# Patient Record
Sex: Female | Born: 1940 | Race: White | Hispanic: No | Smoking: Former smoker
Health system: Southern US, Community
[De-identification: ages and names within clinical notes are randomized; demographics above are authoritative.]

## PROBLEM LIST (undated history)

## (undated) DIAGNOSIS — E785 Hyperlipidemia, unspecified: Secondary | ICD-10-CM

## (undated) DIAGNOSIS — M545 Low back pain, unspecified: Secondary | ICD-10-CM

## (undated) DIAGNOSIS — I10 Essential (primary) hypertension: Secondary | ICD-10-CM

## (undated) DIAGNOSIS — G8929 Other chronic pain: Secondary | ICD-10-CM

## (undated) DIAGNOSIS — R55 Syncope and collapse: Secondary | ICD-10-CM

## (undated) HISTORY — DX: Hyperlipidemia, unspecified: E78.5

## (undated) HISTORY — PX: TONSILLECTOMY: SUR1361

## (undated) HISTORY — PX: FRACTURE SURGERY: SHX138

## (undated) HISTORY — DX: Essential (primary) hypertension: I10

---

## 1999-03-21 HISTORY — PX: PERCUTANEOUS PINNING CALCANEAL FRACTURE: SUR1010

## 2000-03-20 HISTORY — PX: CALCANEUS HARDWARE REMOVAL: SHX1283

## 2004-03-20 HISTORY — PX: VAGINAL HYSTERECTOMY: SUR661

## 2004-08-05 LAB — HM PAP SMEAR

## 2005-03-20 HISTORY — PX: BLADDER SUSPENSION: SHX72

## 2011-08-03 ENCOUNTER — Encounter: Payer: Self-pay | Admitting: Internal Medicine

## 2011-08-03 ENCOUNTER — Ambulatory Visit (INDEPENDENT_AMBULATORY_CARE_PROVIDER_SITE_OTHER): Payer: Medicare Other | Admitting: Internal Medicine

## 2011-08-03 VITALS — BP 116/68 | HR 97 | Temp 98.4°F | Resp 14 | Ht 61.0 in | Wt 151.8 lb

## 2011-08-03 DIAGNOSIS — I10 Essential (primary) hypertension: Secondary | ICD-10-CM

## 2011-08-03 DIAGNOSIS — Z135 Encounter for screening for eye and ear disorders: Secondary | ICD-10-CM

## 2011-08-03 DIAGNOSIS — E785 Hyperlipidemia, unspecified: Secondary | ICD-10-CM | POA: Insufficient documentation

## 2011-08-03 DIAGNOSIS — M545 Low back pain: Secondary | ICD-10-CM

## 2011-08-03 DIAGNOSIS — Z0000 Encounter for general adult medical examination without abnormal findings: Secondary | ICD-10-CM

## 2011-08-03 DIAGNOSIS — J302 Other seasonal allergic rhinitis: Secondary | ICD-10-CM

## 2011-08-03 DIAGNOSIS — Z1239 Encounter for other screening for malignant neoplasm of breast: Secondary | ICD-10-CM

## 2011-08-03 DIAGNOSIS — J309 Allergic rhinitis, unspecified: Secondary | ICD-10-CM

## 2011-08-03 MED ORDER — QUINAPRIL-HYDROCHLOROTHIAZIDE 20-25 MG PO TABS: 1.0000 | ORAL_TABLET | Freq: Every day | ORAL | Status: DC

## 2011-08-03 MED ORDER — SIMVASTATIN 20 MG PO TABS: 20.0000 mg | ORAL_TABLET | Freq: Every evening | ORAL | Status: DC

## 2011-08-03 NOTE — Assessment & Plan Note (Signed)
Recently treated for sinusitis secondary to congestion from allergies.  With z pack,   fluticasone and  Zyrtec.

## 2011-08-03 NOTE — Progress Notes (Signed)
Patient ID: Lori Jimenez, female   DOB: 1940/03/27, 71 y.o.   MRN: [id number].  The patient is here for annual Medicare wellness examination and management of other chronic and acute problems.   The risk factors are reflected in the social history.  The roster of all physicians providing medical care to patient - is listed in the Snapshot section of the chart.  Activities of daily living:  The patient is 100% independent in all ADLs: dressing, toileting, feeding as well as independent mobility  Home safety : The patient has smoke detectors in the home. They wear seatbelts.  There are no firearms at home. There is no violence in the home.   There is no risks for hepatitis, STDs or HIV. There is no   history of blood transfusion. They have no travel history to infectious disease endemic areas of the world.  The patient has seen their dentist in the last six month. They have seen their eye doctor in the last year. They admit to slight hearing difficulty with regard to whispered voices and some television programs.  They have deferred audiologic testing in the last year.  They do not  have excessive sun exposure. Discussed the need for sun protection: hats, long sleeves and use of sunscreen if there is significant sun exposure.   Diet: the importance of a healthy diet is discussed. They do have a healthy diet.  The benefits of regular aerobic exercise were discussed. She walks 4 times per week ,  20 minutes.   Depression screen: there are no signs or vegative symptoms of depression- irritability, change in appetite, anhedonia, sadness/tearfullness.  Cognitive assessment: the patient manages all their financial and personal affairs and is actively engaged. They could relate day,date,year and events; recalled 2/3 objects at 3 minutes; performed clock-face test normally.  The following portions of the patient's history were reviewed and updated as appropriate: allergies, current medications, past  family history, past medical history,  past surgical history, past social history  and problem list.  Visual acuity was not assessed per patient preference since she has regular follow up with her ophthalmologist. Hearing and body mass index were assessed and reviewed.   During the course of the visit the patient was educated and counseled about appropriate screening and preventive services including : fall prevention , diabetes screening, nutrition counseling, colorectal cancer screening, and recommended immunizations.    BP 116/68  Pulse 97  Temp(Src) 98.4 F (36.9 C) (Oral)  Resp 14  Ht 5\' 1"  (1.549 m)  Wt 151 lb 12 oz (68.833 kg)  BMI 28.67 kg/m2  SpO2 96%  General appearance: alert, cooperative and appears stated age Ears: normal TM's and external ear canals both ears Throat: lips, mucosa, and tongue normal; teeth and gums normal Neck: no adenopathy, no carotid bruit, supple, symmetrical, trachea midline and thyroid not enlarged, symmetric, no tenderness/mass/nodules Back: symmetric, no curvature. ROM normal. No CVA tenderness. Lungs: clear to auscultation bilaterally Heart: regular rate and rhythm, S1, S2 normal, no murmur, click, rub or gallop Abdomen: soft, non-tender; bowel sounds normal; no masses,  no organomegaly GYN: external genitalia atrophy appropriate foe age, vaginal vault normal, ovaries nonpalpable. Pulses: 2+ and symmetric Skin: Skin color, texture, turgor normal. No rashes or lesions Lymph nodes: Cervical, supraclavicular, and axillary nodes normal.  Assessment and Plan  Allergic rhinitis, seasonal Recently treated for sinusitis secondary to congestion from allergies.  With z pack,   fluticasone and  Zyrtec.    Hyperlipidemia She has been taking  simvastatin for goal LDL less than 100 for over 8 month without fasting lipids or hepatic function are or panel.   she is fortunately fasting today we'll have these labs drawn.  Hypertension Well controlled on  current medications.  No changes today. Renal function ordered.    Routine general medical examination at a health care facility Breast and pelvic exam was done today as part of her annual physical. Mammogram is ordered. She doesn't require Paps since she is status post hysterectomy    Updated Medication List Outpatient Encounter Prescriptions as of 08/03/2011  Medication Sig Dispense Refill  . aspirin 81 MG tablet Take 81 mg by mouth daily.      . calcium carbonate (TUMS - DOSED IN MG ELEMENTAL CALCIUM) 500 MG chewable tablet Chew 1 tablet by mouth daily.      . Cholecalciferol (VITAMIN D3) 1000 UNITS CAPS Take by mouth.      Boris Lown Oil 300 MG CAPS Take by mouth.      . quinapril-hydrochlorothiazide (ACCURETIC) 20-25 MG per tablet Take 1 tablet by mouth daily.  90 tablet  3  . simvastatin (ZOCOR) 20 MG tablet Take 1 tablet (20 mg total) by mouth every evening.  90 tablet  2  . DISCONTD: quinapril-hydrochlorothiazide (ACCURETIC) 20-25 MG per tablet Take 1 tablet by mouth daily.      Marland Kitchen DISCONTD: simvastatin (ZOCOR) 20 MG tablet Take 20 mg by mouth every evening.

## 2011-08-06 ENCOUNTER — Encounter: Payer: Self-pay | Admitting: Internal Medicine

## 2011-08-06 DIAGNOSIS — E785 Hyperlipidemia, unspecified: Secondary | ICD-10-CM | POA: Insufficient documentation

## 2011-08-06 DIAGNOSIS — Z0000 Encounter for general adult medical examination without abnormal findings: Secondary | ICD-10-CM | POA: Insufficient documentation

## 2011-08-06 NOTE — Assessment & Plan Note (Signed)
Breast and pelvic exam was done today as part of her annual physical. Mammogram is ordered. She doesn't require Paps since she is status post hysterectomy

## 2011-08-06 NOTE — Assessment & Plan Note (Signed)
Well controlled on current medications.  No changes today. Renal function ordered.

## 2011-08-06 NOTE — Assessment & Plan Note (Signed)
She has been taking simvastatin for goal LDL less than 100 for over 8 month without fasting lipids or hepatic function are or panel.   she is fortunately fasting today we'll have these labs drawn.

## 2011-08-07 ENCOUNTER — Other Ambulatory Visit: Payer: Medicare Other

## 2011-08-07 ENCOUNTER — Other Ambulatory Visit (INDEPENDENT_AMBULATORY_CARE_PROVIDER_SITE_OTHER): Payer: Medicare Other | Admitting: *Deleted

## 2011-08-07 DIAGNOSIS — E785 Hyperlipidemia, unspecified: Secondary | ICD-10-CM

## 2011-08-07 DIAGNOSIS — I10 Essential (primary) hypertension: Secondary | ICD-10-CM

## 2011-08-07 LAB — LIPID PANEL
Cholesterol: 145 mg/dL (ref 0–200)
LDL Cholesterol: 68 mg/dL (ref 0–99)
VLDL: 23.6 mg/dL (ref 0.0–40.0)

## 2011-08-08 LAB — COMPLETE METABOLIC PANEL WITH GFR
AST: 20 U/L (ref 0–37)
Albumin: 4.8 g/dL (ref 3.5–5.2)
Alkaline Phosphatase: 56 U/L (ref 39–117)
BUN: 12 mg/dL (ref 6–23)
Calcium: 9.8 mg/dL (ref 8.4–10.5)
Chloride: 99 mEq/L (ref 96–112)
Potassium: 3.9 mEq/L (ref 3.5–5.3)
Sodium: 138 mEq/L (ref 135–145)
Total Protein: 7.1 g/dL (ref 6.0–8.3)

## 2011-08-09 ENCOUNTER — Telehealth: Payer: Self-pay | Admitting: *Deleted

## 2011-08-09 NOTE — Telephone Encounter (Signed)
Patient says that she was told that she should come back in for a pneumonia shot, but she had one in 2007. She says that her insurance will not cover it since she has already had one and was over the age of 12 when she got it. Patient is asking if it is necessary for her to have another one.

## 2011-08-09 NOTE — Telephone Encounter (Signed)
No,  document last one in chart

## 2011-08-09 NOTE — Telephone Encounter (Signed)
Chart updated

## 2011-10-26 ENCOUNTER — Encounter: Payer: Self-pay | Admitting: Internal Medicine

## 2011-10-30 ENCOUNTER — Telehealth: Payer: Self-pay | Admitting: Internal Medicine

## 2011-10-30 NOTE — Telephone Encounter (Signed)
Received email back from [facility] and this charge has been taken off of patient's bill.  She no longer has a balance on her account. I also left her another message in regards to this information. I did leave a detailed message and also asked the patient to return my call so I would know she received the message.  This has been taken care of.

## 2011-10-30 NOTE — Telephone Encounter (Signed)
Patient aware and very happy that this has been taken care of.

## 2011-10-30 NOTE — Telephone Encounter (Signed)
Pt called back told her that they working to get her problem resolved

## 2011-10-30 NOTE — Telephone Encounter (Signed)
After talking with Dr. Darrick Huntsman she asked me to send email to billing to get this charge taking off of patient's account.  It was for DOS 5.16.13 where she was charged for pelvic/breast exam, patient came in with bill and stated that  She didn't get on the exam table.  I have left message for patient to return my call so I can tell her that we are trying to get this resolved for her.

## 2011-12-22 ENCOUNTER — Encounter: Payer: Self-pay | Admitting: Internal Medicine

## 2012-05-28 ENCOUNTER — Other Ambulatory Visit: Payer: Self-pay | Admitting: Internal Medicine

## 2012-05-28 DIAGNOSIS — Z0000 Encounter for general adult medical examination without abnormal findings: Secondary | ICD-10-CM

## 2012-05-28 NOTE — Telephone Encounter (Signed)
Pt last seen 08/03/11. Please advise.

## 2012-05-31 ENCOUNTER — Other Ambulatory Visit: Payer: Medicare Other

## 2012-05-31 ENCOUNTER — Other Ambulatory Visit: Payer: Self-pay | Admitting: General Practice

## 2012-05-31 ENCOUNTER — Telehealth: Payer: Self-pay | Admitting: General Practice

## 2012-05-31 MED ORDER — SIMVASTATIN 20 MG PO TABS: 20.0000 mg | ORAL_TABLET | Freq: Every evening | ORAL | Status: DC

## 2012-05-31 NOTE — Telephone Encounter (Signed)
Pt walked in today and scheduled her CMET can you place these orders. I filled the simvastatin #30 with 0 refills. Pt has a CPE scheduled with you on 4/21

## 2012-05-31 NOTE — Addendum Note (Signed)
Addended by: Jackson Latino on: 05/31/2012 10:59 AM   Modules accepted: Orders

## 2012-05-31 NOTE — Telephone Encounter (Signed)
Orders placed.

## 2012-05-31 NOTE — Telephone Encounter (Signed)
Pt walked in today and scheduled appt for her labs.

## 2012-06-03 ENCOUNTER — Other Ambulatory Visit (INDEPENDENT_AMBULATORY_CARE_PROVIDER_SITE_OTHER): Payer: Medicare Other

## 2012-06-03 DIAGNOSIS — Z1239 Encounter for other screening for malignant neoplasm of breast: Secondary | ICD-10-CM

## 2012-06-03 DIAGNOSIS — Z0000 Encounter for general adult medical examination without abnormal findings: Secondary | ICD-10-CM

## 2012-06-03 DIAGNOSIS — E785 Hyperlipidemia, unspecified: Secondary | ICD-10-CM

## 2012-06-03 LAB — COMPREHENSIVE METABOLIC PANEL
AST: 20 U/L (ref 0–37)
Albumin: 4.3 g/dL (ref 3.5–5.2)
BUN: 10 mg/dL (ref 6–23)
CO2: 29 mEq/L (ref 19–32)
Calcium: 9.2 mg/dL (ref 8.4–10.5)
Chloride: 95 mEq/L — ABNORMAL LOW (ref 96–112)
Creatinine, Ser: 0.6 mg/dL (ref 0.4–1.2)
GFR: 98.2 mL/min (ref >60.00)
Glucose, Bld: 113 mg/dL — ABNORMAL HIGH (ref 70–99)
Potassium: 3.5 mEq/L (ref 3.5–5.1)

## 2012-07-11 ENCOUNTER — Telehealth: Payer: Self-pay | Admitting: *Deleted

## 2012-07-11 ENCOUNTER — Other Ambulatory Visit: Payer: Self-pay | Admitting: *Deleted

## 2012-07-11 NOTE — Telephone Encounter (Signed)
Called [facility] to see if the code could be added with the labs done on 3.17.2014, they said that since it has been over a month it cant be done

## 2012-07-11 NOTE — Telephone Encounter (Signed)
Lori Jimenez,  I do not know how to resubmit theses labs, but the codes would be V58.69 and 272.4  Please resubmit.  thanks

## 2012-07-11 NOTE — Telephone Encounter (Signed)
Lab work from 06/03/12 patient states medicare called and stated labs not coded for medical necessity and ask if you would please resubmit so medicare will pay?

## 2012-07-11 NOTE — Telephone Encounter (Signed)
Carollee Herter please handle.  [facility] is mistaken , we have always been able to rebill labs under different codes.    Also please remind patient that she is supposed to be seen every 6 months, not once a year  since we are treating hypertension and hyperlipidemia

## 2012-07-12 NOTE — Telephone Encounter (Signed)
I have sent an email to charge correction for them to change the dx codes per Dr. Darrick Huntsman.

## 2012-07-12 NOTE — Telephone Encounter (Signed)
Lori Jimenez Dr. Darrick Huntsman wanted you to take a look at this please.

## 2012-07-12 NOTE — Telephone Encounter (Signed)
Patient notified by phone of corrections.

## 2012-07-16 ENCOUNTER — Other Ambulatory Visit: Payer: Self-pay | Admitting: Internal Medicine

## 2012-08-07 ENCOUNTER — Encounter: Payer: Self-pay | Admitting: Internal Medicine

## 2012-08-07 ENCOUNTER — Ambulatory Visit (INDEPENDENT_AMBULATORY_CARE_PROVIDER_SITE_OTHER): Payer: Medicare Other | Admitting: Internal Medicine

## 2012-08-07 VITALS — BP 120/80 | HR 80 | Temp 98.2°F | Resp 16 | Ht 62.0 in | Wt 154.5 lb

## 2012-08-07 DIAGNOSIS — E785 Hyperlipidemia, unspecified: Secondary | ICD-10-CM

## 2012-08-07 DIAGNOSIS — Z0000 Encounter for general adult medical examination without abnormal findings: Secondary | ICD-10-CM

## 2012-08-07 DIAGNOSIS — M545 Low back pain: Secondary | ICD-10-CM

## 2012-08-07 DIAGNOSIS — I10 Essential (primary) hypertension: Secondary | ICD-10-CM

## 2012-08-07 MED ORDER — TETANUS-DIPHTH-ACELL PERTUSSIS 5-2.5-18.5 LF-MCG/0.5 IM SUSP: 0.5000 mL | Freq: Once | INTRAMUSCULAR | Status: DC

## 2012-08-07 MED ORDER — QUINAPRIL-HYDROCHLOROTHIAZIDE 20-25 MG PO TABS: 1.0000 | ORAL_TABLET | Freq: Every day | ORAL | Status: DC

## 2012-08-07 MED ORDER — TRAMADOL HCL 50 MG PO TABS: 50.0000 mg | ORAL_TABLET | Freq: Three times a day (TID) | ORAL | Status: DC | PRN

## 2012-08-07 MED ORDER — SIMVASTATIN 20 MG PO TABS: ORAL_TABLET | ORAL | Status: DC

## 2012-08-07 NOTE — Patient Instructions (Addendum)
I want to treat your back pain. We will try a pure pain reliever,   I am going to prescribe tramadol 50 mg   (maximum dose 4 times daily ,  Start with one at bedtime)  This can be combined with tylenol if needed.  The maximum dose of tyleno is 1000 mg at one time,  Or 2000 mg in a 24 hr period.   You may add with either aleve or motrin  To the above regimen .  If you decide to use motrin (ibuprofen) ,  Start with 200 mg ,  Max dose is 800 mg every 8 hours .  For aleve follow bottle instruction.  DO NOT COMBINE ALEVE WITH MOTRIN because they are too similar.  Please call if you need help in escalating your doses of tramadol or if you do not tolerate it ,  Or if it doesn't work   Return in 6 months for  fasting labs    This is  One version of a  "Low GI"  Diet:  It will still lower your blood sugars and allow you to lose 4 to 8  lbs  per month if you follow it carefully.  Your goal with exercise is a minimum of 30 minutes of aerobic exercise 5 days per week (Walking does not count once it becomes easy!)    All of the foods can be found at grocery stores and in bulk at [employer].  The Atkins protein bars and shakes are available in more varieties at Target, WalMart and [employer].     7 AM Breakfast:  Choose from the following:  Low carbohydrate Protein  Shakes (I recommend the [employer]AS AdvantEdge "Carb Control" shakes  Or the low carb shakes by Atkins.    2.5 carbs   Arnold's "Sandwhich Thin"toasted  w/ peanut butter (no jelly: about 20 net carbs  "Bagel Thin" with cream cheese and salmon: about 20 carbs   a scrambled egg/bacon/cheese burrito made with Mission's "carb balance" whole wheat tortilla  (about 10 net carbs )   Avoid cereal and bananas, oatmeal and cream of wheat and grits. They are loaded with carbohydrates!   10 AM: high protein snack  Protein bar by Atkins (the snack size, under 200 cal, usually < 6 net carbs).    A stick of cheese:  Around 1 carb,  100 cal     Dannon Light n Fit  [place] Yogurt  (80 cal, 8 carbs)  Other so called "protein bars" and Greek yogurts tend to be loaded with carbohydrates.  Remember, in food advertising, the word "energy" is synonymous for " carbohydrate."  Lunch:   A Sandwich using the bread choices listed, Can use any  Eggs,  lunchmeat, grilled meat or canned tuna), avocado, regular mayo/mustard  and cheese.  A Salad using blue cheese, ranch,  Goddess or vinagrette,  No croutons or "confetti" and no "candied nuts" but regular nuts OK.   No pretzels or chips.  Pickles and miniature sweet peppers are a good low carb alternative that provide a "crunch"  The bread is the only source of carbohydrate in a sandwich and  can be decreased by trying some of these alternatives to traditional loaf bread  Joseph's makes a pita bread and a flat bread that are 50 cal and 4 net carbs available at BJs and WalMart.  This can be toasted to use with hummous as well  Toufayan makes a low carb flatbread that's 100 cal and  9 net carbs available at [employer] and [employer] makes 2 sizes of  Low carb whole wheat tortilla  (The large one is 210 cal and 6 net carbs) Avoid "Low fat dressings, as well as Reyne Dumas and [address] dressings They are loaded with sugar!   3 PM/ Mid day  Snack:  Consider  1 ounce of  almonds, walnuts, pistachios, pecans, peanuts,  Macadamia nuts or a nut medley.  Avoid "granola"; the dried cranberries and raisins are loaded with carbohydrates. Mixed nuts as long as there are no raisins,  cranberries or dried fruit.     6 PM  Dinner:     Meat/fowl/fish with a green salad, and either broccoli, cauliflower, green beans, spinach, brussel sprouts or  Lima beans. DO NOT BREAD THE PROTEIN!!      There is a low carb pasta by Dreamfield's that is acceptable and tastes great: only 5 digestible carbs/serving.( All grocery stores but BJs carry it )  Try Kai Levins Angelo's chicken piccata or chicken or eggplant parm over low carb pasta.(Lowes and BJs)    Clifton Custard Sanchez's "Carnitas" (pulled pork, no sauce,  0 carbs) or his beef pot roast to make a dinner burrito (at BJ's)  Pesto over low carb pasta (bj's sells a good quality pesto in the center refrigerated section of the deli   Whole wheat pasta is still full of digestible carbs and  Not as low in glycemic index as Dreamfield's.   Brown rice is still rice,  So skip the rice and noodles if you eat [place] or [place] (or at least limit to 1/2 cup)  9 PM snack :   Breyer's "low carb" fudgsicle or  ice cream bar (Carb Smart line), or  Weight Watcher's ice cream bar , or another "no sugar added" ice cream;  a serving of fresh berries/cherries with whipped cream   Cheese or DANNON'S [place] YOGURT  Avoid bananas, pineapple, grapes  and watermelon on a regular basis because they are high in sugar.  THINK OF THEM AS DESSERT  Remember that snack Substitutions should be less than 10 NET carbs per serving and meals < 20 carbs. Remember to subtract fiber grams to get the "net carbs."

## 2012-08-07 NOTE — Progress Notes (Signed)
Patient ID: Lori Jimenez, female   DOB: 06/14/1940, 72 y.o.   MRN: [id number]   The patient is here for annual Medicare wellness examination and management of other chronic and acute problems.   She continues to defer an annual  pelvic exam despite discussion because she is  S/p hysterectomy,   Pelvic sling years ago in [place]. She had a CMEt done in March, and her liver enzymes were normal but her fasting glucose was slightley elevated which was discussed today.  She does a monthly  Self breast exam,,  Does not want one today or a mammogram.    Has Chronic insomnia,  Wakes  Up every hour,  Attributes it to chronic pain in lower back due to DDD .  No meds currenlty except an OTC magnesium supplement to help her relax. Has tried melatonin     The risk factors are reflected in the social history.  The roster of all physicians providing medical care to patient - is listed in the Snapshot section of the chart.  Activities of daily living:  The patient is 100% independent in all ADLs: dressing, toileting, feeding as well as independent mobility  Home safety : The patient has smoke detectors in the home. They wear seatbelts.  There are no firearms at home. There is no violence in the home.   There is no risks for hepatitis, STDs or HIV. There is no   history of blood transfusion. They have no travel history to infectious disease endemic areas of the world.  The patient has seen their dentist in the last six month. They have seen their eye doctor in the last year. They admit to slight hearing difficulty with regard to whispered voices and some television programs.  They have deferred audiologic testing in the last year.  They do not  have excessive sun exposure. Discussed the need for sun protection: hats, long sleeves and use of sunscreen if there is significant sun exposure.   Diet: the importance of a healthy diet is discussed. They do have a healthy diet.  The benefits of regular aerobic exercise  were discussed. She walks 4 times per week ,  20 minutes.   Depression screen: there are no signs or vegative symptoms of depression- irritability, change in appetite, anhedonia, sadness/tearfullness.  Cognitive assessment: the patient manages all their financial and personal affairs and is actively engaged. They could relate day,date,year and events; recalled 2/3 objects at 3 minutes; performed clock-face test normally.  The following portions of the patient's history were reviewed and updated as appropriate: allergies, current medications, past family history, past medical history,  past surgical history, past social history  and problem list.  Visual acuity was not assessed per patient preference since she has regular follow up with her ophthalmologist. Hearing and body mass index were assessed and reviewed.   During the course of the visit the patient was educated and counseled about appropriate screening and preventive services including : fall prevention , diabetes screening, nutrition counseling, colorectal cancer screening, and recommended immunizations.    Objective  BP 120/80  Pulse 80  Temp(Src) 98.2 F (36.8 C) (Oral)  Resp 16  Ht 5\' 2"  (1.575 m)  Wt 154 lb 8 oz (70.081 kg)  BMI 28.25 kg/m2  SpO2 98%   General appearance: alert, cooperative and appears stated age Ears: normal TM's and external ear canals both ears Throat: lips, mucosa, and tongue normal; teeth and gums normal Neck: no adenopathy, no carotid bruit, supple, symmetrical, trachea  midline and thyroid not enlarged, symmetric, no tenderness/mass/nodules Back: symmetric, no curvature. ROM normal. No CVA tenderness. Lungs: clear to auscultation bilaterally Heart: regular rate and rhythm, S1, S2 normal, no murmur, click, rub or gallop Abdomen: soft, non-tender; bowel sounds normal; no masses,  no organomegaly Pulses: 2+ and symmetric Skin: Skin color, texture, turgor normal. No rashes or lesions Lymph nodes:  Cervical, supraclavicular, and axillary nodes normal.  Assessment and Plan  Back pain, lumbosacral Discussion of  Risks and benefits of medication .  Treatment advised since her pain is interfering with her sleep. Trial of Tramadol prescribed.   Routine general medical examination at a health care facility Annual comprehensive exam was done today excluding breast, pelvic per patient preference. All screenings have been addressed and she continues to defer mammogram and colonoscopy.   Other and unspecified hyperlipidemia Well controlled on current regimen. Liver enzymes normal in March, no changes today.  Hypertension Well controlled on current regimen. Renal function stable, no changes today.   Updated Medication List Outpatient Encounter Prescriptions as of 08/07/2012  Medication Sig Dispense Refill  . aspirin 81 MG tablet Take 81 mg by mouth daily.      . calcium carbonate (TUMS - DOSED IN MG ELEMENTAL CALCIUM) 500 MG chewable tablet Chew 1 tablet by mouth daily.      . Cholecalciferol (VITAMIN D3) 1000 UNITS CAPS Take by mouth.      Boris Lown Oil 300 MG CAPS Take by mouth.      . quinapril-hydrochlorothiazide (ACCURETIC) 20-25 MG per tablet Take 1 tablet by mouth daily.  90 tablet  3  . simvastatin (ZOCOR) 20 MG tablet TAKE ONE TABLET BY MOUTH DAILY IN THE EVENING  90 tablet  1  . [DISCONTINUED] quinapril-hydrochlorothiazide (ACCURETIC) 20-25 MG per tablet Take 1 tablet by mouth daily.  90 tablet  3  . [DISCONTINUED] simvastatin (ZOCOR) 20 MG tablet TAKE ONE TABLET BY MOUTH DAILY IN THE EVENING  30 tablet  0  . TDaP (BOOSTRIX) 5-2.5-18.5 LF-MCG/0.5 injection Inject 0.5 mLs into the muscle once.  0.5 mL  0  . traMADol (ULTRAM) 50 MG tablet Take 1 tablet (50 mg total) by mouth every 8 (eight) hours as needed for pain.  120 tablet  1   No facility-administered encounter medications on file as of 08/07/2012.

## 2012-08-07 NOTE — Assessment & Plan Note (Addendum)
Discussion of  Risks and benefits of medication .  Treatment advised since her pain is interfering with her sleep. Trial of Tramadol prescribed.

## 2012-08-09 NOTE — Assessment & Plan Note (Signed)
Well controlled on current regimen. Renal function stable, no changes today. 

## 2012-08-09 NOTE — Assessment & Plan Note (Signed)
Well controlled on current regimen. Liver enzymes normal in March, no changes today.

## 2012-08-09 NOTE — Assessment & Plan Note (Signed)
Annual comprehensive exam was done today excluding breast, pelvic per patient preference. All screenings have been addressed and she continues to defer mammogram and colonoscopy.

## 2013-02-15 ENCOUNTER — Other Ambulatory Visit: Payer: Self-pay | Admitting: Internal Medicine

## 2013-03-06 ENCOUNTER — Telehealth: Payer: Self-pay | Admitting: *Deleted

## 2013-03-06 DIAGNOSIS — E785 Hyperlipidemia, unspecified: Secondary | ICD-10-CM

## 2013-03-06 DIAGNOSIS — I10 Essential (primary) hypertension: Secondary | ICD-10-CM

## 2013-03-06 DIAGNOSIS — Z79899 Other long term (current) drug therapy: Secondary | ICD-10-CM

## 2013-03-06 NOTE — Telephone Encounter (Signed)
Pt is coming in for labs tomorrow 12.19.2014 what labs and dx?  

## 2013-03-07 ENCOUNTER — Other Ambulatory Visit (INDEPENDENT_AMBULATORY_CARE_PROVIDER_SITE_OTHER): Payer: Medicare Other

## 2013-03-07 ENCOUNTER — Ambulatory Visit: Payer: Medicare Other

## 2013-03-07 ENCOUNTER — Telehealth: Payer: Self-pay | Admitting: *Deleted

## 2013-03-07 DIAGNOSIS — Z79899 Other long term (current) drug therapy: Secondary | ICD-10-CM

## 2013-03-07 DIAGNOSIS — I10 Essential (primary) hypertension: Secondary | ICD-10-CM

## 2013-03-07 DIAGNOSIS — E785 Hyperlipidemia, unspecified: Secondary | ICD-10-CM

## 2013-03-07 LAB — COMPREHENSIVE METABOLIC PANEL
CO2: 29 mEq/L (ref 19–32)
Creatinine, Ser: 0.7 mg/dL (ref 0.4–1.2)
GFR: 89.73 mL/min (ref >60.00)
Glucose, Bld: 96 mg/dL (ref 70–99)
Total Bilirubin: 0.9 mg/dL (ref 0.3–1.2)

## 2013-03-07 LAB — LIPID PANEL
HDL: 50.1 mg/dL (ref >39.00)
Triglycerides: 77 mg/dL (ref 0.0–149.0)

## 2013-03-07 NOTE — Telephone Encounter (Signed)
i cancelled the cmet bc it was coded incorrectly and re entered it with the V58.69 code

## 2013-03-07 NOTE — Addendum Note (Signed)
Addended by: Montine Circle D on: 03/07/2013 01:42 PM   Modules accepted: Orders

## 2013-03-07 NOTE — Telephone Encounter (Signed)
Pt stated she called [facility] billing and they told her to make sure this is billed as medical necessity

## 2013-03-07 NOTE — Telephone Encounter (Signed)
Pt came in for labs today and said her labs were a medical necessity because of the statin drug she is taking.  She wanted to make sure it was coded this way so insurance will pay Pt said it was coded for high bp.  The meds are for chol

## 2013-03-07 NOTE — Telephone Encounter (Addendum)
Meant see other telephone note, Dr. Darrick Huntsman has corrected coding

## 2013-03-07 NOTE — Telephone Encounter (Signed)
Pt came in and stated she needed the labs that were done today to be properly coded?  that last year they weren't and she had to pay out of pocket

## 2013-03-25 ENCOUNTER — Other Ambulatory Visit: Payer: Self-pay | Admitting: Internal Medicine

## 2013-06-27 ENCOUNTER — Telehealth: Payer: Self-pay | Admitting: *Deleted

## 2013-06-27 NOTE — Telephone Encounter (Signed)
Patient requesting refill on tramadol and ask to be sent to [facility] [place]  Last OV 5/14 please advise.

## 2013-06-30 MED ORDER — TRAMADOL HCL 50 MG PO TABS: 50.0000 mg | ORAL_TABLET | Freq: Three times a day (TID) | ORAL | Status: DC | PRN

## 2013-06-30 NOTE — Telephone Encounter (Signed)
Ok to refill,  printed rx  

## 2013-07-01 NOTE — Telephone Encounter (Signed)
Rx called to pharmacy due to fax not completed

## 2013-07-01 NOTE — Telephone Encounter (Signed)
Rx faxed to pharmacy. Left message with pt, notifying it had been sent.

## 2013-09-01 ENCOUNTER — Encounter: Payer: Self-pay | Admitting: Internal Medicine

## 2013-09-01 ENCOUNTER — Ambulatory Visit (INDEPENDENT_AMBULATORY_CARE_PROVIDER_SITE_OTHER): Payer: Medicare Other | Admitting: Internal Medicine

## 2013-09-01 VITALS — BP 138/74 | HR 93 | Temp 97.9°F | Resp 16 | Ht 62.0 in | Wt 161.5 lb

## 2013-09-01 DIAGNOSIS — Z23 Encounter for immunization: Secondary | ICD-10-CM

## 2013-09-01 DIAGNOSIS — G4700 Insomnia, unspecified: Secondary | ICD-10-CM

## 2013-09-01 DIAGNOSIS — E785 Hyperlipidemia, unspecified: Secondary | ICD-10-CM

## 2013-09-01 DIAGNOSIS — I10 Essential (primary) hypertension: Secondary | ICD-10-CM

## 2013-09-01 DIAGNOSIS — E876 Hypokalemia: Secondary | ICD-10-CM

## 2013-09-01 DIAGNOSIS — Z0000 Encounter for general adult medical examination without abnormal findings: Secondary | ICD-10-CM

## 2013-09-01 DIAGNOSIS — Z79899 Other long term (current) drug therapy: Secondary | ICD-10-CM

## 2013-09-01 MED ORDER — ZOLPIDEM TARTRATE 5 MG PO TABS: 5.0000 mg | ORAL_TABLET | Freq: Every evening | ORAL | Status: DC | PRN

## 2013-09-01 NOTE — Assessment & Plan Note (Addendum)
Chronic for years, with frequent wakenings.  Requesting trial of ambien, 10 mg  Because her friends use it.   I have reviewed the dangers of prescribing this drug to the elderly with patient.  5 mg maximal dose

## 2013-09-01 NOTE — Progress Notes (Signed)
Pre-visit discussion using our clinic review tool. No additional management support is needed unless otherwise documented below in the visit note.  

## 2013-09-01 NOTE — Patient Instructions (Signed)
You had your annual Medicare wellness exam today  You received the pneumonia vaccine today.  We will contact you with the bloodwork results

## 2013-09-01 NOTE — Progress Notes (Signed)
Patient ID: Lori Jimenez, female   DOB: January 15, 1941, 73 y.o.   MRN: [id number]   The patient is here for annual Medicare wellness examination and management of other chronic and acute problems.   Annual nongyn exam.  She continues to decline  All age appropriate screenings including mammogram ,  Colon Ca screening, or DEXa scan due to her age.   The risk factors are reflected in the social history.  The roster of all physicians providing medical care to patient - is listed in the Snapshot section of the chart.  Activities of daily living:  The patient is 100% independent in all ADLs: dressing, toileting, feeding as well as independent mobility  Home safety : The patient has smoke detectors in the home. They wear seatbelts.  There are no firearms at home. There is no violence in the home.   There is no risks for hepatitis, STDs or HIV. There is no   history of blood transfusion. They have no travel history to infectious disease endemic areas of the world.  The patient has seen their dentist in the last six month. They have seen their eye doctor in the last year. They admit to slight hearing difficulty with regard to whispered voices and some television programs.  They have deferred audiologic testing in the last year.  They do not  have excessive sun exposure. Discussed the need for sun protection: hats, long sleeves and use of sunscreen if there is significant sun exposure.   Diet: the importance of a healthy diet is discussed. They do have a healthy diet.  The benefits of regular aerobic exercise were discussed. She walks 4 times per week ,  20 minutes.   Depression screen: there are no signs or vegative symptoms of depression- irritability, change in appetite, anhedonia, sadness/tearfullness.  Cognitive assessment: the patient manages all their financial and personal affairs and is actively engaged. They could relate day,date,year and events; recalled 2/3 objects at 3 minutes; performed  clock-face test normally.  The following portions of the patient's history were reviewed and updated as appropriate: allergies, current medications, past family history, past medical history,  past surgical history, past social history  and problem list.  Visual acuity was not assessed per patient preference since she has regular follow up with her ophthalmologist. Hearing and body mass index were assessed and reviewed.   During the course of the visit the patient was educated and counseled about appropriate screening and preventive services including : fall prevention , diabetes screening, nutrition counseling, colorectal cancer screening, and recommended immunizations.  She has again deferred screenign for colon and breast CA .  Objective: BP 138/74  Pulse 93  Temp(Src) 97.9 F (36.6 C) (Oral)  Resp 16  Ht 5\' 2"  (1.575 m)  Wt 161 lb 8 oz (73.256 kg)  BMI 29.53 kg/m2  SpO2 97%   General appearance: alert, cooperative and appears stated age Ears: normal TM's and external ear canals both ears Throat: lips, mucosa, and tongue normal; teeth and gums normal Neck: no adenopathy, no carotid bruit, supple, symmetrical, trachea midline and thyroid not enlarged, symmetric, no tenderness/mass/nodules Back: symmetric, no curvature. ROM normal. No CVA tenderness. Lungs: clear to auscultation bilaterally Heart: regular rate and rhythm, S1, S2 normal, no murmur, click, rub or gallop Abdomen: soft, non-tender; bowel sounds normal; no masses,  no organomegaly Pulses: 2+ and symmetric Skin: Skin color, texture, turgor normal. No rashes or lesions Lymph nodes: Cervical, supraclavicular, and axillary nodes normal.  Assessment and Plan:  Insomnia, persistent Chronic for years, with frequent wakenings.  Requesting trial of ambien, 10 mg  Because her friends use it.   I have reviewed the dangers of prescribing this drug to the elderly with patient.  5 mg maximal dose   Routine general medical  examination at a health care facility Annual comprehensive exam was done excluding breast, pelvic and PAP smear. All screenings have been addressed  But declined by patient.    Other and unspecified hyperlipidemia Well controlled on current regimen. Liver enzymes normal in March, no changes today.  Lab Results  Component Value Date   CHOL 154 03/07/2013   HDL 50.10 03/07/2013   LDLCALC 89 03/07/2013   TRIG 77.0 03/07/2013   CHOLHDL 3 03/07/2013   Lab Results  Component Value Date   ALT 16 09/01/2013   AST 24 09/01/2013   ALKPHOS 49 09/01/2013   BILITOT 0.3 09/01/2013     Hypertension Well controlled on current regimen. Renal function stable, no changes today.  Lab Results  Component Value Date   CREATININE 0.8 09/01/2013   Lab Results  Component Value Date   NA 136 09/01/2013   K 3.4* 09/01/2013   CL 102 09/01/2013   CO2 27 09/01/2013     Hypokalemia Very mild  Will replace for 5 days.    Updated Medication List Outpatient Encounter Prescriptions as of 09/01/2013  Medication Sig  . aspirin 81 MG tablet Take 81 mg by mouth daily.  . calcium carbonate (TUMS - DOSED IN MG ELEMENTAL CALCIUM) 500 MG chewable tablet Chew 1 tablet by mouth daily.  . Cholecalciferol (VITAMIN D3) 1000 UNITS CAPS Take by mouth.  . quinapril-hydrochlorothiazide (ACCURETIC) 20-25 MG per tablet Take 1 tablet by mouth daily.  . simvastatin (ZOCOR) 20 MG tablet TAKE 1 TABLET BY MOUTH EVERY EVENING  . traMADol (ULTRAM) 50 MG tablet Take 1 tablet (50 mg total) by mouth every 8 (eight) hours as needed.  . potassium chloride SA (K-DUR,KLOR-CON) 20 MEQ tablet Take 1 tablet (20 mEq total) by mouth daily.  . TDaP (BOOSTRIX) 5-2.5-18.5 LF-MCG/0.5 injection Inject 0.5 mLs into the muscle once.  Marland Kitchen zolpidem (AMBIEN) 5 MG tablet Take 1 tablet (5 mg total) by mouth at bedtime as needed for sleep.  . [DISCONTINUED] Krill Oil 300 MG CAPS Take by mouth.

## 2013-09-02 DIAGNOSIS — E876 Hypokalemia: Secondary | ICD-10-CM | POA: Insufficient documentation

## 2013-09-02 LAB — COMPREHENSIVE METABOLIC PANEL
ALK PHOS: 49 U/L (ref 39–117)
ALT: 16 U/L (ref 0–35)
AST: 24 U/L (ref 0–37)
Albumin: 4.3 g/dL (ref 3.5–5.2)
BUN: 16 mg/dL (ref 6–23)
CO2: 27 meq/L (ref 19–32)
Calcium: 9.4 mg/dL (ref 8.4–10.5)
Chloride: 102 mEq/L (ref 96–112)
Creatinine, Ser: 0.8 mg/dL (ref 0.4–1.2)
GFR: 78.81 mL/min (ref >60.00)
Glucose, Bld: 111 mg/dL — ABNORMAL HIGH (ref 70–99)
Potassium: 3.4 mEq/L — ABNORMAL LOW (ref 3.5–5.1)
Sodium: 136 mEq/L (ref 135–145)
TOTAL PROTEIN: 6.6 g/dL (ref 6.0–8.3)
Total Bilirubin: 0.3 mg/dL (ref 0.2–1.2)

## 2013-09-02 MED ORDER — POTASSIUM CHLORIDE CRYS ER 20 MEQ PO TBCR: 20.0000 meq | EXTENDED_RELEASE_TABLET | Freq: Every day | ORAL | Status: DC

## 2013-09-02 NOTE — Assessment & Plan Note (Signed)
Annual comprehensive exam was done excluding breast, pelvic and PAP smear. All screenings have been addressed  But declined by patient.

## 2013-09-02 NOTE — Assessment & Plan Note (Signed)
Very mild  Will replace for 5 days.

## 2013-09-02 NOTE — Assessment & Plan Note (Signed)
Well controlled on current regimen. Renal function stable, no changes today.  Lab Results  Component Value Date   CREATININE 0.8 09/01/2013   Lab Results  Component Value Date   NA 136 09/01/2013   K 3.4* 09/01/2013   CL 102 09/01/2013   CO2 27 09/01/2013

## 2013-09-02 NOTE — Assessment & Plan Note (Signed)
Well controlled on current regimen. Liver enzymes normal in March, no changes today.  Lab Results  Component Value Date   CHOL 154 03/07/2013   HDL 50.10 03/07/2013   LDLCALC 89 03/07/2013   TRIG 77.0 03/07/2013   CHOLHDL 3 03/07/2013   Lab Results  Component Value Date   ALT 16 09/01/2013   AST 24 09/01/2013   ALKPHOS 49 09/01/2013   BILITOT 0.3 09/01/2013

## 2013-09-03 ENCOUNTER — Encounter: Payer: Self-pay | Admitting: *Deleted

## 2013-09-08 ENCOUNTER — Telehealth: Payer: Self-pay | Admitting: Internal Medicine

## 2013-09-08 DIAGNOSIS — E785 Hyperlipidemia, unspecified: Secondary | ICD-10-CM

## 2013-09-08 NOTE — Telephone Encounter (Signed)
Needing labs put in to follow up on her simvastatin. She will be in tomorrow morning.

## 2013-09-08 NOTE — Telephone Encounter (Signed)
Please advise had CMET on 6/15 put in lipid what else?

## 2013-09-09 ENCOUNTER — Other Ambulatory Visit (INDEPENDENT_AMBULATORY_CARE_PROVIDER_SITE_OTHER): Payer: Medicare Other

## 2013-09-09 DIAGNOSIS — E785 Hyperlipidemia, unspecified: Secondary | ICD-10-CM

## 2013-09-09 LAB — LIPID PANEL
CHOL/HDL RATIO: 3
Cholesterol: 171 mg/dL (ref 0–200)
HDL: 56.1 mg/dL (ref >39.00)
LDL CALC: 85 mg/dL (ref 0–99)
NonHDL: 114.9
Triglycerides: 148 mg/dL (ref 0.0–149.0)
VLDL: 29.6 mg/dL (ref 0.0–40.0)

## 2013-09-09 NOTE — Telephone Encounter (Signed)
Left detailed message for patient that record shows patient has been taking simvastatin since 2013 and last lipid was in 2014. Also this is part of medicare wellness exam.

## 2013-09-09 NOTE — Telephone Encounter (Signed)
Pt came in for lab work.  Asking [place] to confirm that the blood work would be covered as medical necessity as the blood work is being done before starting simvastatin.

## 2013-09-11 ENCOUNTER — Encounter: Payer: Self-pay | Admitting: *Deleted

## 2013-09-18 ENCOUNTER — Other Ambulatory Visit: Payer: Self-pay | Admitting: Internal Medicine

## 2013-10-21 ENCOUNTER — Other Ambulatory Visit: Payer: Self-pay | Admitting: Internal Medicine

## 2013-12-20 ENCOUNTER — Other Ambulatory Visit: Payer: Self-pay | Admitting: Internal Medicine

## 2013-12-22 NOTE — Telephone Encounter (Signed)
Ok to refill,  printed rx  

## 2013-12-22 NOTE — Telephone Encounter (Signed)
Last refill 7.6.15, last OV 6.15.15.  Please advise refill.

## 2013-12-23 NOTE — Telephone Encounter (Signed)
Rx faxed

## 2014-03-22 ENCOUNTER — Other Ambulatory Visit: Payer: Self-pay | Admitting: Internal Medicine

## 2014-04-23 ENCOUNTER — Other Ambulatory Visit: Payer: Self-pay | Admitting: Internal Medicine

## 2014-04-24 NOTE — Telephone Encounter (Signed)
Ok to refill,  printed rx  

## 2014-04-24 NOTE — Telephone Encounter (Signed)
Faxed to pharmacy

## 2014-04-24 NOTE — Telephone Encounter (Signed)
Last OV 6.15.15, last refill 10.5.15.  Please advise refill

## 2014-06-17 ENCOUNTER — Other Ambulatory Visit: Payer: Self-pay | Admitting: Internal Medicine

## 2014-06-22 ENCOUNTER — Telehealth: Payer: Self-pay | Admitting: Internal Medicine

## 2014-06-22 DIAGNOSIS — E876 Hypokalemia: Secondary | ICD-10-CM

## 2014-06-22 DIAGNOSIS — E785 Hyperlipidemia, unspecified: Secondary | ICD-10-CM

## 2014-06-22 DIAGNOSIS — I10 Essential (primary) hypertension: Secondary | ICD-10-CM

## 2014-06-22 NOTE — Telephone Encounter (Signed)
Pt came in today and stated that the pharmacy told her the she needs to get blood work done before she can refill rx. Please advise/msn

## 2014-06-22 NOTE — Telephone Encounter (Signed)
Lori Jimenez: She is due an appointment with Dr. Derrel Nip and a fasting lab appointment a few days prior, please schedule, thanks!  Dr. Derrel Nip: What labs would you like?

## 2014-06-23 ENCOUNTER — Telehealth: Payer: Self-pay | Admitting: Internal Medicine

## 2014-06-23 NOTE — Telephone Encounter (Deleted)
Spoke with patient and informed her that she will need to make appt to see Dr. Derrel Nip and have fasting labs prior to visit. Pt stated that she doesn't understand why she need to see Dr. Derrel Nip. Pt stated that she only see's Dr. Derrel Nip once a year. Pt is scheduled for a wellness visit in June. Please advise/msn

## 2014-06-23 NOTE — Telephone Encounter (Signed)
Left msg for pt to call the office to schedule appt 4/5.msn

## 2014-06-23 NOTE — Telephone Encounter (Signed)
Pt was notified, declined to schedule an appt while on the phone with me. States she will call back to schedule

## 2014-06-23 NOTE — Telephone Encounter (Signed)
PATINETS  WITH HYPERTENSION NEED TO BE SEEN TWICE A YEAR.  NOW THAT WE HAVE A WAY OF KEEPING TRACK,  WE WILL NEED HER TO RESPECT THOSE GUIDELINES

## 2014-06-23 NOTE — Telephone Encounter (Signed)
Spoke with patient and informed her that she will need to make appt to see Dr. Derrel Nip and have fasting labs prior to visit. Pt stated that she doesn't understand why she need to see Dr. Derrel Nip. Pt stated that she only see's Dr. Derrel Nip once a year. Pt is scheduled for a wellness visit in June. Please advise/msn

## 2014-07-10 ENCOUNTER — Other Ambulatory Visit (INDEPENDENT_AMBULATORY_CARE_PROVIDER_SITE_OTHER): Payer: PPO

## 2014-07-10 DIAGNOSIS — E785 Hyperlipidemia, unspecified: Secondary | ICD-10-CM

## 2014-07-10 DIAGNOSIS — I10 Essential (primary) hypertension: Secondary | ICD-10-CM

## 2014-07-10 LAB — COMPREHENSIVE METABOLIC PANEL
ALT: 12 U/L (ref 0–35)
AST: 17 U/L (ref 0–37)
Albumin: 4.5 g/dL (ref 3.5–5.2)
Alkaline Phosphatase: 53 U/L (ref 39–117)
BUN: 12 mg/dL (ref 6–23)
CO2: 33 meq/L — AB (ref 19–32)
CREATININE: 0.72 mg/dL (ref 0.40–1.20)
Calcium: 9.7 mg/dL (ref 8.4–10.5)
Chloride: 98 mEq/L (ref 96–112)
GFR: 83.7 mL/min (ref >60.00)
GLUCOSE: 116 mg/dL — AB (ref 70–99)
Potassium: 3.4 mEq/L — ABNORMAL LOW (ref 3.5–5.1)
Sodium: 137 mEq/L (ref 135–145)
Total Bilirubin: 0.6 mg/dL (ref 0.2–1.2)
Total Protein: 7.3 g/dL (ref 6.0–8.3)

## 2014-07-10 LAB — LIPID PANEL
CHOLESTEROL: 145 mg/dL (ref 0–200)
HDL: 49.9 mg/dL (ref >39.00)
LDL Cholesterol: 73 mg/dL (ref 0–99)
NonHDL: 95.1
TRIGLYCERIDES: 113 mg/dL (ref 0.0–149.0)
Total CHOL/HDL Ratio: 3
VLDL: 22.6 mg/dL (ref 0.0–40.0)

## 2014-07-13 ENCOUNTER — Telehealth: Payer: Self-pay | Admitting: *Deleted

## 2014-07-13 ENCOUNTER — Encounter: Payer: Self-pay | Admitting: Internal Medicine

## 2014-07-13 ENCOUNTER — Ambulatory Visit (INDEPENDENT_AMBULATORY_CARE_PROVIDER_SITE_OTHER): Payer: PPO | Admitting: Internal Medicine

## 2014-07-13 VITALS — BP 126/88 | HR 100 | Temp 98.6°F | Resp 16 | Ht 62.0 in | Wt 155.8 lb

## 2014-07-13 DIAGNOSIS — M545 Low back pain, unspecified: Secondary | ICD-10-CM

## 2014-07-13 DIAGNOSIS — E785 Hyperlipidemia, unspecified: Secondary | ICD-10-CM

## 2014-07-13 DIAGNOSIS — Z13820 Encounter for screening for osteoporosis: Secondary | ICD-10-CM

## 2014-07-13 DIAGNOSIS — I700 Atherosclerosis of aorta: Secondary | ICD-10-CM | POA: Diagnosis not present

## 2014-07-13 DIAGNOSIS — I10 Essential (primary) hypertension: Secondary | ICD-10-CM

## 2014-07-13 DIAGNOSIS — E559 Vitamin D deficiency, unspecified: Secondary | ICD-10-CM

## 2014-07-13 DIAGNOSIS — R7301 Impaired fasting glucose: Secondary | ICD-10-CM | POA: Diagnosis not present

## 2014-07-13 DIAGNOSIS — R739 Hyperglycemia, unspecified: Secondary | ICD-10-CM

## 2014-07-13 DIAGNOSIS — E876 Hypokalemia: Secondary | ICD-10-CM

## 2014-07-13 DIAGNOSIS — M5489 Other dorsalgia: Secondary | ICD-10-CM

## 2014-07-13 LAB — VITAMIN D 25 HYDROXY (VIT D DEFICIENCY, FRACTURES): VITD: 48.3 ng/mL (ref 30.00–100.00)

## 2014-07-13 LAB — HEMOGLOBIN A1C: HEMOGLOBIN A1C: 5.8 % (ref 4.6–6.5)

## 2014-07-13 MED ORDER — LOSARTAN POTASSIUM-HCTZ 100-25 MG PO TABS: 1.0000 | ORAL_TABLET | Freq: Every day | ORAL | Status: DC

## 2014-07-13 NOTE — Assessment & Plan Note (Addendum)
secondary to DDD by x rays done in 2010.  Flares at times with sciatica.  Not taking advil or tramadol  medication daily .  Does not want referral to PT or sports medicine.

## 2014-07-13 NOTE — Telephone Encounter (Signed)
Labs and dx?  

## 2014-07-13 NOTE — Patient Instructions (Addendum)
DEXA scan has been ordered.  I recommend getting the majority of your calcium and Vitamin D  through diet rather than supplements given the recent association of calcium supplements with increased coronary artery calcium scores.  You need 1200 mg calcium and 2000 IU  of vitamin D3 daily   Try the Silk Almond  Milk Light,  Original formula.  Delicious,  Low carb,  Low cal,  Cholesterol free has 430 mg calcium per glass  Your last fasting glucose indicates you are at risk for developing diabetes, so I am checking an A1c today   I want you to lose 25 lbs over the next six months with a low glycemic index diet and regular exercise (30 minutes of cardio 5 days per week is your goal)  This is  my version of a  "Low GI"  Diet:  It will still lower your blood sugars and allow you to lose 4 to 8  lbs  per month if you follow it carefully.  Your goal with exercise is a minimum of 30 minutes of aerobic exercise 5 days per week (Walking does not count once it becomes easy!)     All of the foods can be found at grocery stores and in bulk at [employer].  The Atkins protein bars and shakes are available in more varieties at [employer], [employer] and [employer].     7 AM Breakfast:  Choose from the following:  Low carbohydrate Protein  Shakes (I recommend the EAS AdvantEdge "Carb Control" shakes  Or the low carb shakes by Atkins.    2.5 carbs   Arnold's "Sandwhich Thin"toasted  w/ peanut butter (no jelly: about 20 net carbs  "Bagel Thin" with cream cheese and salmon: about 20 carbs   a scrambled egg/bacon/cheese burrito made with Mission's "carb balance" whole wheat tortilla  (about 10 net carbs )  A slice of home made fritatta (egg based dish without a crust:  google it)    Avoid cereal and bananas, oatmeal and cream of wheat and grits. They are loaded with carbohydrates!   10 AM: high protein snack  Protein bar by Atkins (the snack size, under 200 cal, usually < 6 net carbs).    A stick of cheese:  Around 1  carb,  100 cal     Dannon Light n Fit [place] Yogurt  (80 cal, 8 carbs)  Other so called "protein bars" and Greek yogurts tend to be loaded with carbohydrates.  Remember, in food advertising, the word "energy" is synonymous for " carbohydrate."  Lunch:   A Sandwich using the bread choices listed, Can use any  Eggs,  lunchmeat, grilled meat or canned tuna), avocado, regular mayo/mustard  and cheese.  A Salad using blue cheese, ranch,  Goddess or vinagrette,  No croutons or "confetti" and no "candied nuts" but regular nuts OK.   No pretzels or chips.  Pickles and miniature sweet peppers are a good low carb alternative that provide a "crunch"  The bread is the only source of carbohydrate in a sandwich and  can be decreased by trying some of these alternatives to traditional loaf bread  Joseph's makes a pita bread and a flat bread that are 50 cal and 4 net carbs available at [place] and [place].  This can be toasted to use with hummous as well  Toufayan makes a low carb flatbread that's 100 cal and 9 net carbs available at [employer] and [employer]'s makes 2 sizes of  Low carb whole wheat tortilla  (The large one is 210 cal and 6 net carbs)  Flat Out makes flatbreads that are low carb as well  Avoid "Low fat dressings, as well as Barry Brunner and [place] dressings They are loaded with sugar!   3 PM/ Mid day  Snack:  Consider  1 ounce of  almonds, walnuts, pistachios, pecans, peanuts,  Macadamia nuts or a nut medley.  Avoid "granola"; the dried cranberries and raisins are loaded with carbohydrates. Mixed nuts as long as there are no raisins,  cranberries or dried fruit.    Try the prosciutto/mozzarella cheese sticks by Fiorruci  In deli /backery section   High protein   To avoid overindulging in snacks: Try drinking a glass of unsweeted almond/coconut milk  Or a cup of coffee with your Atkins chocolate bar to keep you from having 3!!!   Pork rinds!  Yes Pork Rinds        6 PM  Dinner:      Meat/fowl/fish with a green salad, and either broccoli, cauliflower, green beans, spinach, brussel sprouts or  Lima beans. DO NOT BREAD THE PROTEIN!!      There is a low carb pasta by Dreamfield's that is acceptable and tastes great: only 5 digestible carbs/serving.( All grocery stores but BJs carry it )  Try Hurley Cisco Angelo's chicken piccata or chicken or eggplant parm over low carb pasta.(Lowes and BJs)   Marjory Lies Sanchez's "Carnitas" (pulled pork, no sauce,  0 carbs) or his beef pot roast to make a dinner burrito (at [employer]J's)  Pesto over low carb pasta (bj's sells a good quality pesto in the center refrigerated section of the deli   Try satueeing  Cheral Marker with mushroooms  Whole wheat pasta is still full of digestible carbs and  Not as low in glycemic index as Dreamfield's.   Brown rice is still rice,  So skip the rice and noodles if you eat [place] or [place] (or at least limit to 1/2 cup)  9 PM snack :     8 ounces of [place] unsweetened almond/cococunut milk  Nut butter and crackers (using WASA crackers,  They are low carb)  or pita bread     Avoid bananas, pineapple, grapes  and watermelon on a regular basis because they are high in sugar.  THINK OF THEM AS DESSERT  Remember that snack Substitutions should be less than 10 NET carbs per serving and meals should be < 25 net carbs. Remember that carbohydrates from fiber do not affect blood sugar, so you can  subtract fiber grams to get the "net carbs " of any particular food item.

## 2014-07-13 NOTE — Progress Notes (Signed)
Patient ID: Lori Jimenez, female   DOB: 05-25-1940, 74 y.o.   MRN: [id number]  Patient Active Problem List   Diagnosis Date Noted  . Impaired fasting glucose 07/14/2014  . Atherosclerosis of abdominal aorta 07/13/2014  . Hypokalemia 09/02/2013  . Insomnia, persistent 09/01/2013  . Routine general medical examination at a health care facility 08/06/2011  . Hyperlipidemia   . Allergic rhinitis, seasonal 08/03/2011  . Back pain, lumbosacral 08/03/2011  . Hypertension 08/03/2011    Subjective:  CC:   Chief Complaint  Patient presents with  . Follow-up    labs for medication. C/O left hip pain.    HPI:   Lori Jimenez is a 74 y.o. female who presents for  6 month follow up on chronic conditions including chronic low back pain, and recent labs .  She states that her back pain getting worse. It is aggravated by prolonged sitting,  prolonged standing,  And bending over. She is using tramadol as needed ,  Denies pain radiating to ankl,  No numbess or tingling and  No foot drop.     fasting glucose 116 .  risk for development of diabetes discussed,  Patient takes fatalistic attitude (I know it's coming,  My parents both had it") .  Addressed diet  Need for exercise and weight management.    Past Medical History  Diagnosis Date  . Hypertension   . Hyperlipidemia     Past Surgical History  Procedure Laterality Date  . Appendectomy  1956  . Percutaneous pinning calcaneal fracture  2001    removal of hardware 2002  . Abdominal hysterectomy  2006  . Bladder suspension  2007       The following portions of the patient's history were reviewed and updated as appropriate: Allergies, current medications, and problem list.    Review of Systems:   Patient denies headache, fevers, malaise, unintentional weight loss, skin rash, eye pain, sinus congestion and sinus pain, sore throat, dysphagia,  hemoptysis , cough, dyspnea, wheezing, chest pain, palpitations, orthopnea, edema,  abdominal pain, nausea, melena, diarrhea, constipation, flank pain, dysuria, hematuria, urinary  Frequency, nocturia, numbness, tingling, seizures,  Focal weakness, Loss of consciousness,  Tremor, insomnia, depression, anxiety, and suicidal ideation.     History   Social History  . Marital Status: Widowed    Spouse Name: N/A  . Number of Children: N/A  . Years of Education: N/A   Occupational History  . Not on file.   Social History Main Topics  . Smoking status: Former Smoker    Quit date: 08/02/1961  . Smokeless tobacco: Never Used  . Alcohol Use: Yes     Comment: occasional  . Drug Use: No  . Sexual Activity: Not Currently   Other Topics Concern  . Not on file   Social History Narrative    Objective:  Filed Vitals:   07/13/14 1137  BP: 126/88  Pulse: 100  Temp: 98.6 F (37 C)  Resp: 16     General appearance: alert, cooperative and appears stated age Ears: normal TM's and external ear canals both ears Throat: lips, mucosa, and tongue normal; teeth and gums normal Neck: no adenopathy, no carotid bruit, supple, symmetrical, trachea midline and thyroid not enlarged, symmetric, no tenderness/mass/nodules Back: symmetric, no curvature. ROM normal. No CVA tenderness. Lungs: clear to auscultation bilaterally Heart: regular rate and rhythm, S1, S2 normal, no murmur, click, rub or gallop Abdomen: soft, non-tender; bowel sounds normal; no masses,  no organomegaly Pulses: 2+ and  symmetric Skin: Skin color, texture, turgor normal. No rashes or lesions Lymph nodes: Cervical, supraclavicular, and axillary nodes normal.  Assessment and Plan:  Back pain, lumbosacral secondary to DDD by x rays done in 2010.  Flares at times with sciatica.  Not taking advil or tramadol  medication daily .  Does not want referral to PT or sports medicine.    Impaired fasting glucose I have addressed  BMI and recommended a low glycemic index diet utilizing smaller more frequent meals to  increase metabolism.  I have also recommended that patient start exercising with a goal of 30 minutes of aerobic exercise a minimum of 5 days per week. Screening for lipid disorders,and diabetes to be done today.   .lastaqc Lab Results  Component Value Date   CHOL 145 07/10/2014   HDL 49.90 07/10/2014   LDLCALC 73 07/10/2014   TRIG 113.0 07/10/2014   CHOLHDL 3 07/10/2014   No results found for: TSH     Hypertension Not Well controlled on current regimen. Adding losartan , Renal function stable, will repeat in 3 months   Lab Results  Component Value Date   CREATININE 0.72 07/10/2014   Lab Results  Component Value Date   NA 137 07/10/2014   K 3.4* 07/10/2014   CL 98 07/10/2014   CO2 33* 07/10/2014      Hyperlipidemia Well controlled on current statin therapy.   Liver enzymes are normal , no changes today.  Lab Results  Component Value Date   CHOL 145 07/10/2014   HDL 49.90 07/10/2014   LDLCALC 73 07/10/2014   TRIG 113.0 07/10/2014   CHOLHDL 3 07/10/2014   Lab Results  Component Value Date   ALT 12 07/10/2014   AST 17 07/10/2014   ALKPHOS 53 07/10/2014   BILITOT 0.6 07/10/2014      Hypokalemia Persistent, mild due to hctz.  Changing to losartan/hct    Atherosclerosis of abdominal aorta Noted on prior plain films of lumbar spine.  Asymptomatic,  Continue  lipid and asa therapy .    A total of 40 minutes of face to face time was spent with patient more than half of which was spent in counselling and coordination of care    Updated Medication List Outpatient Encounter Prescriptions as of 07/13/2014  Medication Sig  . aspirin 81 MG tablet Take 81 mg by mouth daily.  . calcium carbonate (TUMS - DOSED IN MG ELEMENTAL CALCIUM) 500 MG chewable tablet Chew 1 tablet by mouth daily.  . Cholecalciferol (VITAMIN D3) 1000 UNITS CAPS Take by mouth.  . potassium chloride SA (K-DUR,KLOR-CON) 20 MEQ tablet Take 1 tablet (20 mEq total) by mouth daily.  . simvastatin  (ZOCOR) 20 MG tablet TAKE 1 TABLET BY MOUTH EVERY EVENING  . traMADol (ULTRAM) 50 MG tablet TAKE 1 TABLET BY MOUTH EVERY 8 HOURS AS NEEDED  . [DISCONTINUED] hydrochlorothiazide (HYDRODIURIL) 25 MG tablet TAKE 1 TABLET BY MOUTH DAILY  . [DISCONTINUED] quinapril (ACCUPRIL) 20 MG tablet TAKE 1 TABLET BY MOUTH EVERY DAY  . losartan-hydrochlorothiazide (HYZAAR) 100-25 MG per tablet Take 1 tablet by mouth daily.  . [DISCONTINUED] quinapril-hydrochlorothiazide (ACCURETIC) 20-25 MG per tablet TAKE 1 TABLET BY MOUTH EVERY DAY (Patient not taking: Reported on 07/13/2014)  . [DISCONTINUED] TDaP (BOOSTRIX) 5-2.5-18.5 LF-MCG/0.5 injection Inject 0.5 mLs into the muscle once. (Patient not taking: Reported on 07/13/2014)  . [DISCONTINUED] zolpidem (AMBIEN) 5 MG tablet Take 1 tablet (5 mg total) by mouth at bedtime as needed for sleep. (Patient not taking: Reported  on 07/13/2014)     Orders Placed This Encounter  Procedures  . DG Bone Density  . Vitamin D (25 hydroxy)  . Hemoglobin A1C    Return in about 6 months (around 01/12/2015).

## 2014-07-13 NOTE — Progress Notes (Signed)
Pre-visit discussion using our clinic review tool. No additional management support is needed unless otherwise documented below in the visit note.  

## 2014-07-14 ENCOUNTER — Encounter: Payer: Self-pay | Admitting: Internal Medicine

## 2014-07-14 DIAGNOSIS — R7301 Impaired fasting glucose: Secondary | ICD-10-CM | POA: Insufficient documentation

## 2014-07-14 NOTE — Assessment & Plan Note (Signed)
Noted on prior plain films of lumbar spine.  Asymptomatic,  Continue  lipid and asa therapy .

## 2014-07-14 NOTE — Assessment & Plan Note (Signed)
Persistent, mild due to hctz.  Changing to losartan/hct

## 2014-07-14 NOTE — Assessment & Plan Note (Signed)
I have addressed  BMI and recommended a low glycemic index diet utilizing smaller more frequent meals to increase metabolism.  I have also recommended that patient start exercising with a goal of 30 minutes of aerobic exercise a minimum of 5 days per week. Screening for lipid disorders,and diabetes to be done today.   .lastaqc Lab Results  Component Value Date   CHOL 145 07/10/2014   HDL 49.90 07/10/2014   LDLCALC 73 07/10/2014   TRIG 113.0 07/10/2014   CHOLHDL 3 07/10/2014   No results found for: TSH

## 2014-07-14 NOTE — Assessment & Plan Note (Signed)
Well controlled on current statin therapy.   Liver enzymes are normal , no changes today.  Lab Results  Component Value Date   CHOL 145 07/10/2014   HDL 49.90 07/10/2014   LDLCALC 73 07/10/2014   TRIG 113.0 07/10/2014   CHOLHDL 3 07/10/2014   Lab Results  Component Value Date   ALT 12 07/10/2014   AST 17 07/10/2014   ALKPHOS 53 07/10/2014   BILITOT 0.6 07/10/2014

## 2014-07-14 NOTE — Assessment & Plan Note (Addendum)
Not Well controlled on current regimen. Adding losartan , Renal function stable, will repeat in 3 months   Lab Results  Component Value Date   CREATININE 0.72 07/10/2014   Lab Results  Component Value Date   NA 137 07/10/2014   K 3.4* 07/10/2014   CL 98 07/10/2014   CO2 33* 07/10/2014

## 2014-07-16 ENCOUNTER — Encounter: Payer: Self-pay | Admitting: *Deleted

## 2014-07-20 DIAGNOSIS — R55 Syncope and collapse: Secondary | ICD-10-CM

## 2014-07-20 HISTORY — DX: Syncope and collapse: R55

## 2014-07-21 ENCOUNTER — Emergency Department (HOSPITAL_COMMUNITY): Payer: PPO

## 2014-07-21 ENCOUNTER — Telehealth: Payer: Self-pay | Admitting: *Deleted

## 2014-07-21 ENCOUNTER — Observation Stay (HOSPITAL_COMMUNITY)
Admission: EM | Admit: 2014-07-21 | Discharge: 2014-07-22 | Disposition: A | Discharge status: Home / Self Care | Payer: PPO | Attending: Internal Medicine | Admitting: Internal Medicine

## 2014-07-21 ENCOUNTER — Encounter (HOSPITAL_COMMUNITY): Payer: Self-pay | Admitting: *Deleted

## 2014-07-21 DIAGNOSIS — Z87891 Personal history of nicotine dependence: Secondary | ICD-10-CM | POA: Diagnosis not present

## 2014-07-21 DIAGNOSIS — Z7982 Long term (current) use of aspirin: Secondary | ICD-10-CM | POA: Insufficient documentation

## 2014-07-21 DIAGNOSIS — E785 Hyperlipidemia, unspecified: Secondary | ICD-10-CM | POA: Insufficient documentation

## 2014-07-21 DIAGNOSIS — R42 Dizziness and giddiness: Secondary | ICD-10-CM | POA: Insufficient documentation

## 2014-07-21 DIAGNOSIS — R51 Headache: Secondary | ICD-10-CM | POA: Diagnosis present

## 2014-07-21 DIAGNOSIS — E663 Overweight: Secondary | ICD-10-CM | POA: Diagnosis not present

## 2014-07-21 DIAGNOSIS — G4700 Insomnia, unspecified: Secondary | ICD-10-CM | POA: Insufficient documentation

## 2014-07-21 DIAGNOSIS — R55 Syncope and collapse: Secondary | ICD-10-CM | POA: Diagnosis not present

## 2014-07-21 DIAGNOSIS — Z6827 Body mass index (BMI) 27.0-27.9, adult: Secondary | ICD-10-CM | POA: Insufficient documentation

## 2014-07-21 DIAGNOSIS — R519 Headache, unspecified: Secondary | ICD-10-CM

## 2014-07-21 DIAGNOSIS — I10 Essential (primary) hypertension: Secondary | ICD-10-CM | POA: Diagnosis not present

## 2014-07-21 DIAGNOSIS — Z90710 Acquired absence of both cervix and uterus: Secondary | ICD-10-CM | POA: Insufficient documentation

## 2014-07-21 HISTORY — DX: Low back pain, unspecified: M54.50

## 2014-07-21 HISTORY — DX: Syncope and collapse: R55

## 2014-07-21 HISTORY — DX: Low back pain: M54.5

## 2014-07-21 HISTORY — DX: Other chronic pain: G89.29

## 2014-07-21 LAB — TSH: TSH: 2.537 u[IU]/mL (ref 0.350–4.500)

## 2014-07-21 LAB — BASIC METABOLIC PANEL
Anion gap: 12 (ref 5–15)
BUN: 7 mg/dL (ref 6–20)
CALCIUM: 9.8 mg/dL (ref 8.9–10.3)
CO2: 25 mmol/L (ref 22–32)
Chloride: 102 mmol/L (ref 101–111)
Creatinine, Ser: 0.62 mg/dL (ref 0.44–1.00)
GFR calc Af Amer: >60 mL/min (ref >60)
Glucose, Bld: 78 mg/dL (ref 70–99)
Potassium: 3.8 mmol/L (ref 3.5–5.1)
SODIUM: 139 mmol/L (ref 135–145)

## 2014-07-21 LAB — URINALYSIS, ROUTINE W REFLEX MICROSCOPIC
BILIRUBIN URINE: NEGATIVE
Glucose, UA: NEGATIVE mg/dL
HGB URINE DIPSTICK: NEGATIVE
Ketones, ur: NEGATIVE mg/dL
NITRITE: NEGATIVE
PROTEIN: NEGATIVE mg/dL
Specific Gravity, Urine: 1.008 (ref 1.005–1.030)
Urobilinogen, UA: 0.2 mg/dL (ref 0.0–1.0)
pH: 6 (ref 5.0–8.0)

## 2014-07-21 LAB — HEPATIC FUNCTION PANEL
ALK PHOS: 55 U/L (ref 38–126)
ALT: 14 U/L (ref 14–54)
AST: 18 U/L (ref 15–41)
Albumin: 4.3 g/dL (ref 3.5–5.0)
Bilirubin, Direct: 0.1 mg/dL (ref 0.1–0.5)
Indirect Bilirubin: 0.5 mg/dL (ref 0.3–0.9)
TOTAL PROTEIN: 7.4 g/dL (ref 6.5–8.1)
Total Bilirubin: 0.6 mg/dL (ref 0.3–1.2)

## 2014-07-21 LAB — CBC
HEMATOCRIT: 43.5 % (ref 36.0–46.0)
HEMOGLOBIN: 14.5 g/dL (ref 12.0–15.0)
MCH: 28.9 pg (ref 26.0–34.0)
MCHC: 33.3 g/dL (ref 30.0–36.0)
MCV: 86.7 fL (ref 78.0–100.0)
Platelets: 277 10*3/uL (ref 150–400)
RBC: 5.02 MIL/uL (ref 3.87–5.11)
RDW: 13.9 % (ref 11.5–15.5)
WBC: 9.3 10*3/uL (ref 4.0–10.5)

## 2014-07-21 LAB — SEDIMENTATION RATE: SED RATE: 4 mm/h (ref 0–22)

## 2014-07-21 LAB — TROPONIN I: Troponin I: <0.03 ng/mL (ref <0.031)

## 2014-07-21 LAB — URINE MICROSCOPIC-ADD ON

## 2014-07-21 LAB — PROTIME-INR
INR: 1.09 (ref 0.00–1.49)
Prothrombin Time: 14.2 seconds (ref 11.6–15.2)

## 2014-07-21 MED ORDER — ACETAMINOPHEN 650 MG RE SUPP: 650.0000 mg | Freq: Four times a day (QID) | RECTAL | Status: DC | PRN

## 2014-07-21 MED ORDER — ONDANSETRON HCL 4 MG/2ML IJ SOLN: 4.0000 mg | Freq: Four times a day (QID) | INTRAMUSCULAR | Status: DC | PRN

## 2014-07-21 MED ORDER — SIMVASTATIN 20 MG PO TABS
20.0000 mg | ORAL_TABLET | Freq: Every evening | ORAL | Status: DC
Start: 2014-07-21 — End: 2014-07-22
  Administered 2014-07-21: 20 mg via ORAL
  Filled 2014-07-21 (×2): qty 1

## 2014-07-21 MED ORDER — ENOXAPARIN SODIUM 40 MG/0.4ML ~~LOC~~ SOLN
40.0000 mg | SUBCUTANEOUS | Status: DC
  Administered 2014-07-21: 40 mg via SUBCUTANEOUS
  Filled 2014-07-21 (×3): qty 0.4

## 2014-07-21 MED ORDER — SODIUM CHLORIDE 0.9 % IJ SOLN
3.0000 mL | Freq: Two times a day (BID) | INTRAMUSCULAR | Status: DC
  Administered 2014-07-21 – 2014-07-22 (×2): 3 mL via INTRAVENOUS

## 2014-07-21 MED ORDER — ASPIRIN 81 MG PO TABS: 81.0000 mg | ORAL_TABLET | Freq: Every day | ORAL | Status: DC

## 2014-07-21 MED ORDER — ACETAMINOPHEN 325 MG PO TABS: 650.0000 mg | ORAL_TABLET | Freq: Four times a day (QID) | ORAL | Status: DC | PRN

## 2014-07-21 MED ORDER — ASPIRIN EC 81 MG PO TBEC
81.0000 mg | DELAYED_RELEASE_TABLET | Freq: Every day | ORAL | Status: DC
  Administered 2014-07-21 – 2014-07-22 (×2): 81 mg via ORAL
  Filled 2014-07-21 (×2): qty 1

## 2014-07-21 MED ORDER — ONDANSETRON HCL 4 MG PO TABS: 4.0000 mg | ORAL_TABLET | Freq: Four times a day (QID) | ORAL | Status: DC | PRN

## 2014-07-21 NOTE — ED Notes (Signed)
Attempted report 

## 2014-07-21 NOTE — ED Provider Notes (Signed)
CSN: [id number]     Arrival date & time 07/21/14  1205 History   First MD Initiated Contact with Patient 07/21/14 1328     Chief Complaint  Patient presents with  . Loss of Consciousness     (Consider location/radiation/quality/duration/timing/severity/associated sxs/prior Treatment) HPI The patient had gone into the kitchen to make coffee as usual. As she was standing at the counter she very quickly got a sensation of stars in her vision and passed out. She was not able to catch her self. She got some bruising on the inside of her right arm and a sore spot on the back of her head. She is not sure for how long she was unconscious. She woke up and realized what had happened. She was able to get back up and did not have any weakness, chest pain or significant pain. Again later that day she began to feel close to passing out but did not. She reports she's had a headache for almost 2 weeks now. It's been constant and a pressure quality on the top of her head. She is also noted a sore spot on her left forehead above her eye. She thinks that area seems swollen at times. Right now she reports she doesn't have any pain in that area. She has not been having any problems with her vision. There's been no blurring or double vision. She denies any focal weakness numbness or tingling. She denies actual gait dysfunction but notes that as a baseline she feels like her gait lists slightly to one side. She has always attributed that to age. There have been no recent medication changes. No recent general illness. Past Medical History  Diagnosis Date  . Hypertension   . Hyperlipidemia    Past Surgical History  Procedure Laterality Date  . Appendectomy  1956  . Percutaneous pinning calcaneal fracture  2001    removal of hardware 2002  . Abdominal hysterectomy  2006  . Bladder suspension  2007   No family history on file. History  Substance Use Topics  . Smoking status: Former Smoker    Quit date: 08/02/1961   . Smokeless tobacco: Never Used  . Alcohol Use: Yes     Comment: occasional   OB History    No data available     Review of Systems 10 Systems reviewed and are negative for acute change except as noted in the HPI.    Allergies  Review of patient's allergies indicates no known allergies.  Home Medications   Prior to Admission medications   Medication Sig Start Date End Date Taking? Authorizing Provider  aspirin 81 MG tablet Take 81 mg by mouth daily.   Yes Historical Provider, MD  calcium carbonate (TUMS - DOSED IN MG ELEMENTAL CALCIUM) 500 MG chewable tablet Chew 1 tablet by mouth daily.   Yes Historical Provider, MD  Cholecalciferol (VITAMIN D3) 1000 UNITS CAPS Take by mouth.   Yes Historical Provider, MD  hydrochlorothiazide (HYDRODIURIL) 25 MG tablet Take 25 mg by mouth daily.   Yes Historical Provider, MD  Probiotic Product (PROBIOTIC DAILY PO) Take 1 tablet by mouth daily.   Yes Historical Provider, MD  quinapril (ACCUPRIL) 20 MG tablet Take 20 mg by mouth daily.   Yes Historical Provider, MD  simvastatin (ZOCOR) 20 MG tablet TAKE 1 TABLET BY MOUTH EVERY EVENING 06/17/14  Yes Crecencio Mc, MD  losartan-hydrochlorothiazide (HYZAAR) 100-25 MG per tablet Take 1 tablet by mouth daily. Patient not taking: Reported on 07/21/2014 07/13/14  Crecencio Mc, MD  potassium chloride SA (K-DUR,KLOR-CON) 20 MEQ tablet Take 1 tablet (20 mEq total) by mouth daily. Patient not taking: Reported on 07/21/2014 09/02/13   Crecencio Mc, MD  traMADol (ULTRAM) 50 MG tablet TAKE 1 TABLET BY MOUTH EVERY 8 HOURS AS NEEDED Patient not taking: Reported on 07/21/2014 04/24/14   Crecencio Mc, MD   BP 128/73 mmHg  Pulse 68  Temp(Src) 98.6 F (37 C)  Resp 20  Ht 5\' 2"  (1.575 m)  Wt 150 lb (68.04 kg)  BMI 27.43 kg/m2  SpO2 96% Physical Exam  Constitutional: She is oriented to person, place, and time. She appears well-developed and well-nourished.  HENT:  Nose: Nose normal.  Mouth/Throat: Oropharynx  is clear and moist.  The patient is a tender but minimally palpable area of contusion to the left parietal scalp. No associated laceration. The area the patient notes is tender periodically and/or swollen above her left eye is normal in appearance at this time with no palpable abnormality. Bilateral TMs normal without erythema or bulging. Dentition is in excellent condition with normal posterior airway patent  Eyes: EOM are normal. Pupils are equal, round, and reactive to light.  Neck: Neck supple.  Cardiovascular: Normal rate, regular rhythm, normal heart sounds and intact distal pulses.   Pulmonary/Chest: Effort normal and breath sounds normal.  Abdominal: Soft. Bowel sounds are normal. She exhibits no distension. There is no tenderness.  Musculoskeletal: Normal range of motion. She exhibits no edema or tenderness.  tenderness.  Neurological: She is alert and oriented to person, place, and time. She has normal strength. No cranial nerve deficit. She exhibits normal muscle tone. Coordination normal. GCS eye subscore is 4. GCS verbal subscore is 5. GCS motor subscore is 6.  Normal finger-nose examination normal heel shin examination. 5\5 strength in the upper and lower extremities.  Skin: Skin is warm, dry and intact.  Psychiatric: She has a normal mood and affect.    ED Course  Procedures (including critical care time) Labs Review Labs Reviewed  URINALYSIS, ROUTINE W REFLEX MICROSCOPIC - Abnormal; Notable for the following:    Leukocytes, UA SMALL (*)    All other components within normal limits  BASIC METABOLIC PANEL  CBC  TROPONIN I  PROTIME-INR  HEPATIC FUNCTION PANEL  SEDIMENTATION RATE  URINE MICROSCOPIC-ADD ON    Imaging Review Dg Chest 2 View  07/21/2014   CLINICAL DATA:  Syncope yesterday, fall, headache  EXAM: CHEST  2 VIEW  COMPARISON:  None.  FINDINGS: Cardiomediastinal silhouette is unremarkable. No acute infiltrate or pleural effusion. No pulmonary edema. Thoracic spine  osteopenia.  IMPRESSION: No active cardiopulmonary disease.   Electronically Signed   By: Lahoma Crocker M.D.   On: 07/21/2014 14:23   Ct Head Wo Contrast  07/21/2014   CLINICAL DATA:  Loss of consciousness. Syncope yesterday. Continued dizziness.  EXAM: CT HEAD WITHOUT CONTRAST  TECHNIQUE: Contiguous axial images were obtained from the base of the skull through the vertex without intravenous contrast.  COMPARISON:  None.  FINDINGS: Patchy low-density throughout the deep white matter, most compatible with chronic microvascular disease. Mild age related volume loss. No acute intracranial abnormality. Specifically, no hemorrhage, hydrocephalus, mass lesion, acute infarction, or significant intracranial injury. No acute calvarial abnormality. Visualized paranasal sinuses and mastoids clear. Orbital soft tissues unremarkable.  IMPRESSION: No acute intracranial abnormality.   Electronically Signed   By: Rolm Baptise M.D.   On: 07/21/2014 14:31     EKG Interpretation  Date/Time:  Tuesday Jul 21 2014 12:30:01 EDT Ventricular Rate:  85 PR Interval:  158 QRS Duration: 78 QT Interval:  360 QTC Calculation: 428 R Axis:   -81 Text Interpretation:  Normal sinus rhythm Left axis deviation  Anterolateral infarct , age undetermined Abnormal ECG Confirmed by  Johnney Killian, MD, Jeannie Done [phone]) on 07/21/2014 3:44:36 PM      MDM   Final diagnoses:  Syncope and collapse  Chronic nonintractable headache, unspecified headache type   The patient be admitted for syncopal episode. By description this sounded more likely to be cardiac type of syncope with no warning symptoms and essentially fairly sudden loss of consciousness. Upon awakening the patient did not have any residual symptoms of headache or neurologic dysfunction or nausea or vomiting. She does identify some headache she's had for several weeks at the top of her head although it did not have any malignant qualities to it. There was no sudden onset, is been daily in  nature, there is been no associated neurologic dysfunction, nausea, double vision or loss of vision. The patient be admitted for ongoing monitoring and evaluation for her syncopal episode.    Charlesetta Shanks, MD 07/21/14 [phone]

## 2014-07-21 NOTE — ED Notes (Signed)
Admitting MD at bedside.

## 2014-07-21 NOTE — Telephone Encounter (Signed)
Patient is going to ER verified by daughter leaving now. Patient has change in behavior and C/O severe headache.

## 2014-07-21 NOTE — ED Notes (Signed)
Pt states that she had a syncopal episode yesterday. Pt states that she has felt "like she was going to pass out" a second time yesterday as well. Pt reports headache since the fall and for 2 weeks prior.  Denies neck pain. Reports family hx of strokes

## 2014-07-21 NOTE — Telephone Encounter (Signed)
Patient Name: Lori Jimenez  DOB: 1940-10-17    Initial Comment Caller states mother c/o severe headache from head injury, fainted yesterday and fell - daughter states she has noticed a difference in her behavior   Nurse Assessment  Nurse: Raphael Gibney, RN, Vanita Ingles Date/Time (Eastern Time): 07/21/2014 10:32:13 AM  Confirm and document reason for call. If symptomatic, describe symptoms. ---Caller states her mother fell and hit her head yesterday. She fainted yesterday and hit her head. Daughter is not sure how long mother lost consciousness. Had another dizzy spell yesterday after she fainted. Has had headaches for the past 2 weeks. Has been dizzy for the past 2 weeks. Has a knot on her head. Daughter states there is a change in behavior.  Has the patient traveled out of the country within the last 30 days? ---Not Applicable  Does the patient require triage? ---Yes  Related visit to physician within the last 2 weeks? ---No  Does the PT have any chronic conditions? (i.e. diabetes, asthma, etc.) ---No     Guidelines    Guideline Title Affirmed Question Affirmed Notes  Head Injury [1] SEVERE headache AND [2] not improved 2 hours after pain medicine/ice packs    Final Disposition User   Go to ED Now Raphael Gibney, RN, Vera    Comments  Triage outcome upgraded to go to ER now as daughter states mother has a change in behavior

## 2014-07-21 NOTE — Telephone Encounter (Signed)
Pt called requesting MRI to be scheduled.  States she and Dr Derrel Nip discussed this at her last appoint.  Pt states she had an episode yesterday where she blacked out in her kitchen causing her to fall and is experiencing headaches.  Please advise order

## 2014-07-21 NOTE — H&P (Signed)
History and Physical  Lori Jimenez INO:[id number] DOB: 1940/04/02 DOA: 07/21/2014  Referring physician: Cecil Cranker, ER physician PCP: Crecencio Mc, MD   Chief Complaint: Passed out  HPI: Lori Jimenez is a 74 y.o. female  With past medical history of hypertension and hyperlipidemia who is been overall her usual state of health other than noticing a somewhat persistent headache on the top of her head for the past 2 weeks. Otherwise she's had no issues with chest pain, lightheadedness, overexertion, dehydration, diarrhea or other symptoms. On the evening of 5/2, patient was walking from another room into her kitchen. She was standing when all of a sudden she felt like she was seeing stars and then the next thing he knew, she woke up on the floor. She was unclear exactly how long she passed out. She felt she truly passed out as she had some bruising on her right arm and on her head where she had clearly hit things when she fallen. She was able to get up without assistance. She did not immediately mention this to her daughter. She did bring it up when her daughter asked about going to the grocery store and the patient insisted that her daughter drive after what happened. Daughter was concerned, but patient dismissed ideas and going to the hospital right away. At the grocery store, in the checkout line, patient started feeling similar symptoms of seeing stars and put her head down at the credit card swiping machine. Cashier and patient's daughter got patient wheelchair and they were able to to get patient to the car. Patient still insisted on staying home and then the next morning still felt somewhat woozy so she came into the emergency room on 5/3.  In the emergency room, CT scan of the head done unremarkable. EKG unremarkable. Lab work and vital signs unremarkable. Patient was not orthostatic. Hospitalists were called for further evaluation.   Review of Systems:  Patient seen in the emergency  room. Pt complains of mild headache, although feeling better  Pt denies any vision changes, dysphagia, chest pain, palpitations, shortness of breath, wheeze, cough, abdominal pain, hematuria, dysuria, constipation, diarrhea, focal extremity numbness weakness or pain.  Review of systems are otherwise negative  Past Medical History  Diagnosis Date  . Hypertension   . Hyperlipidemia    Past Surgical History  Procedure Laterality Date  . Appendectomy  1956  . Percutaneous pinning calcaneal fracture  2001    removal of hardware 2002  . Abdominal hysterectomy  2006  . Bladder suspension  2007   Social History:  reports that she quit smoking about 53 years ago. She has never used smokeless tobacco. She reports that she drinks alcohol. She reports that she does not use illicit drugs. Patient lives at home with her daughter & is able to participate in activities of daily living with out assistance. She is quite active.  No Known Allergies  Family history: Stroke and CAD  Prior to Admission medications   Medication Sig Start Date End Date Taking? Authorizing Provider  aspirin 81 MG tablet Take 81 mg by mouth daily.   Yes Historical Provider, MD  hydrochlorothiazide (HYDRODIURIL) 25 MG tablet Take 25 mg by mouth daily.   Yes Historical Provider, MD  Probiotic Product (PROBIOTIC DAILY PO) Take 1 tablet by mouth daily.   Yes Historical Provider, MD  quinapril (ACCUPRIL) 20 MG tablet Take 20 mg by mouth daily.   Yes Historical Provider, MD  simvastatin (ZOCOR) 20 MG tablet TAKE  1 TABLET BY MOUTH EVERY EVENING 06/17/14  Yes Crecencio Mc, MD    Physical Exam: BP 128/73 mmHg  Pulse 73  Temp(Src) 98.8 F (37.1 C) (Oral)  Resp 20  Ht 5\' 2"  (1.575 m)  Wt 70.9 kg (156 lb 4.9 oz)  BMI 28.58 kg/m2  SpO2 96%  General:  Alert and oriented 3, no acute distress Eyes: Sclera nonicteric, extraocular movements are intact ENT: Normocephalic, atraumatic, mucous members are moist Neck: No  JVD Cardiovascular: Regular rate and rhythm, S1-S2 Respiratory: Clear to auscultation bilaterally Abdomen: Soft, obese, nontender, positive bowel sounds Skin: No skin breaks, tears or lesions Musculoskeletal: No clubbing cyanosis or edema Psychiatric: \Patient is appropriate, no evidence of psychoses Neurologic: No focal deficits. Cranial nerves II through XII intact. Normal finger to nose. Downgoing toes. Sensation intact.           Labs on Admission:  Basic Metabolic Panel:  Recent Labs Lab 07/21/14 1238  NA 139  K 3.8  CL 102  CO2 25  GLUCOSE 78  BUN 7  CREATININE 0.62  CALCIUM 9.8   Liver Function Tests:  Recent Labs Lab 07/21/14 1402  AST 18  ALT 14  ALKPHOS 55  BILITOT 0.6  PROT 7.4  ALBUMIN 4.3   No results for input(s): LIPASE, AMYLASE in the last 168 hours. No results for input(s): AMMONIA in the last 168 hours. CBC:  Recent Labs Lab 07/21/14 1238  WBC 9.3  HGB 14.5  HCT 43.5  MCV 86.7  PLT 277   Cardiac Enzymes:  Recent Labs Lab 07/21/14 1402  TROPONINI <0.03    BNP (last 3 results) No results for input(s): BNP in the last 8760 hours.  ProBNP (last 3 results) No results for input(s): PROBNP in the last 8760 hours.  CBG: No results for input(s): GLUCAP in the last 168 hours.  Radiological Exams on Admission: Dg Chest 2 View  07/21/2014   CLINICAL DATA:  Syncope yesterday, fall, headache  EXAM: CHEST  2 VIEW  COMPARISON:  None.  FINDINGS: Cardiomediastinal silhouette is unremarkable. No acute infiltrate or pleural effusion. No pulmonary edema. Thoracic spine osteopenia.  IMPRESSION: No active cardiopulmonary disease.   Electronically Signed   By: Lahoma Crocker M.D.   On: 07/21/2014 14:23   Ct Head Wo Contrast  07/21/2014   CLINICAL DATA:  Loss of consciousness. Syncope yesterday. Continued dizziness.  EXAM: CT HEAD WITHOUT CONTRAST  TECHNIQUE: Contiguous axial images were obtained from the base of the skull through the vertex without  intravenous contrast.  COMPARISON:  None.  FINDINGS: Patchy low-density throughout the deep white matter, most compatible with chronic microvascular disease. Mild age related volume loss. No acute intracranial abnormality. Specifically, no hemorrhage, hydrocephalus, mass lesion, acute infarction, or significant intracranial injury. No acute calvarial abnormality. Visualized paranasal sinuses and mastoids clear. Orbital soft tissues unremarkable.  IMPRESSION: No acute intracranial abnormality.   Electronically Signed   By: Rolm Baptise M.D.   On: 07/21/2014 14:31    EKG: Independently reviewed. Normal sinus rhythm with left axis deviation  Assessment/Plan Present on Admission:  . Syncope and collapse: Unclear etiology. Spontaneous. No lab abnormalities. Monitor on telemetry. Check orthostatics in the morning. Gently hydrate. CT scan unrevealing. No focal deficits, so no reason to check MRI. We will go ahead and check echocardiogram.  Headaches: Again, unclear etiology. CT scan unrevealing. Monitor for episodes of elevated blood pressure which may be a contributor factor?  Marland Kitchen Insomnia, persistent: Chronic issue. Patient declines additional medications for  this.  . Hypertension: In case patient is having episodes of hypotension or orthostasis, holding antihypertensives. Continue to monitor.  . Hyperlipidemia: Stable, continue statin  . Overweight (BMI 25.0-29.9) patient meets criteria with BMI greater than 25   Consultants: None  Code Status: Full code  Family Communication: Daughter the bedside   Disposition Plan: Likely home tomorrow  Time spent: 75 minutes  [facility] Pager [phone]

## 2014-07-22 ENCOUNTER — Observation Stay (HOSPITAL_COMMUNITY): Payer: PPO

## 2014-07-22 DIAGNOSIS — I951 Orthostatic hypotension: Secondary | ICD-10-CM

## 2014-07-22 DIAGNOSIS — I10 Essential (primary) hypertension: Secondary | ICD-10-CM | POA: Diagnosis not present

## 2014-07-22 DIAGNOSIS — R55 Syncope and collapse: Secondary | ICD-10-CM | POA: Diagnosis not present

## 2014-07-22 DIAGNOSIS — E785 Hyperlipidemia, unspecified: Secondary | ICD-10-CM | POA: Diagnosis not present

## 2014-07-22 LAB — GLUCOSE, CAPILLARY: Glucose-Capillary: 110 mg/dL — ABNORMAL HIGH (ref 70–99)

## 2014-07-22 MED ORDER — LOSARTAN POTASSIUM 50 MG PO TABS
100.0000 mg | ORAL_TABLET | Freq: Every day | ORAL | Status: DC
  Administered 2014-07-22: 100 mg via ORAL
  Filled 2014-07-22: qty 2

## 2014-07-22 NOTE — Progress Notes (Signed)
Echocardiogram 2D Echocardiogram has been performed.  Lori Jimenez 07/22/2014, 3:02 PM

## 2014-07-22 NOTE — Progress Notes (Signed)
Pt ambulated with daughter 100 feet without dizziness or lightheadedness.

## 2014-07-22 NOTE — Discharge Summary (Signed)
Physician Discharge Summary  Lori Jimenez IRS:[id number] DOB: January 08, 1941 DOA: 07/21/2014  PCP: Crecencio Mc, MD  Admit date: 07/21/2014 Discharge date: 07/22/2014  Time spent: 55 minutes   Discharge Condition: stable Diet recommendation: low sodium heart healthy diet  Discharge Diagnoses:  Principal Problem:   Syncope and collapse Active Problems:   Hypertension   Hyperlipidemia   Insomnia, persistent   Overweight (BMI 25.0-29.9)   History of present illness:  With past medical history of hypertension and hyperlipidemia who is been overall her usual state of health other than noticing a somewhat persistent headache on the top of her head for the past 2 weeks. Otherwise she's had no issues with chest pain, lightheadedness, overexertion, dehydration, diarrhea or other symptoms. On the evening of 5/2, patient was walking from another room into her kitchen. She was standing when all of a sudden she felt like she was seeing stars and then the next thing he knew, she woke up on the floor. She was unclear exactly how long she passed out. She felt she truly passed out as she had some bruising on her right arm and on her head where she had clearly hit things when she fallen. She was able to get up without assistance. She did not immediately mention this to her daughter. She did bring it up when her daughter asked about going to the grocery store and the patient insisted that her daughter drive after what happened. Daughter was concerned, but patient dismissed ideas and going to the hospital right away. At the grocery store, in the checkout line, patient started feeling similar symptoms of seeing stars and put her head down at the credit card swiping machine. Cashier and patient's daughter got patient wheelchair and they were able to to get patient to the car. Patient still insisted on staying home and then the next morning still felt somewhat woozy so she came into the emergency room on  5/3.   Hospital Course:   Syncope and collapse - CT scan of the head in the ER negative and EKG unrevealing -Patient was hydrated with IV fluids as her orthostatic vitals were noted to be positive in the ER- according to the patient and the daughter, she drinks a great deal of fluids and they are not sure how she could be dehydrated. Of note she is also on HCTZ. -Repeat orthostatic vitals today are negative and her symptoms of near syncope have resolved-she has ambulated in the hall without any symptoms -2-D echo also performed- report not yet in EPIC but according to a verbal report from Dr Ron Parker, the ECHO is normal with good LF function and and EF of 60%  Hypertension -Continue home doses of antihypertensives  Hyperlipidemia -Continue statin  Obesity Body mass index is 28.58 kg/(m^2).   Procedures:  2-D echo  Discharge Exam: Filed Weights   07/21/14 1223 07/21/14 1954 07/22/14 0559  Weight: 68.04 kg (150 lb) 70.9 kg (156 lb 4.9 oz) 70.9 kg (156 lb 4.9 oz)   Filed Vitals:   07/22/14 1259  BP: 139/67  Pulse: 79  Temp:   Resp:     General: AAO x 3, no distress Cardiovascular: RRR, no murmurs  Respiratory: clear to auscultation bilaterally GI: soft, non-tender, non-distended, bowel sound positive  Discharge Instructions You were cared for by a hospitalist during your hospital stay. If you have any questions about your discharge medications or the care you received while you were in the hospital after you are discharged, you can call  the unit and asked to speak with the hospitalist on call if the hospitalist that took care of you is not available. Once you are discharged, your primary care physician will handle any further medical issues. Please note that NO REFILLS for any discharge medications will be authorized once you are discharged, as it is imperative that you return to your primary care physician (or establish a relationship with a primary care physician if you do not  have one) for your aftercare needs so that they can reassess your need for medications and monitor your lab values.  Discharge Instructions    Diet - low sodium heart healthy    Complete by:  As directed      Increase activity slowly    Complete by:  As directed             Medication List    TAKE these medications        aspirin 81 MG tablet  Take 81 mg by mouth daily.     hydrochlorothiazide 25 MG tablet  Commonly known as:  HYDRODIURIL  Take 25 mg by mouth daily.     PROBIOTIC DAILY PO  Take 1 tablet by mouth daily.     quinapril 20 MG tablet  Commonly known as:  ACCUPRIL  Take 20 mg by mouth daily.     simvastatin 20 MG tablet  Commonly known as:  ZOCOR  TAKE 1 TABLET BY MOUTH EVERY EVENING       No Known Allergies    The results of significant diagnostics from this hospitalization (including imaging, microbiology, ancillary and laboratory) are listed below for reference.    Significant Diagnostic Studies: Dg Chest 2 View  07/21/2014   CLINICAL DATA:  Syncope yesterday, fall, headache  EXAM: CHEST  2 VIEW  COMPARISON:  None.  FINDINGS: Cardiomediastinal silhouette is unremarkable. No acute infiltrate or pleural effusion. No pulmonary edema. Thoracic spine osteopenia.  IMPRESSION: No active cardiopulmonary disease.   Electronically Signed   By: Lahoma Crocker M.D.   On: 07/21/2014 14:23   Ct Head Wo Contrast  07/21/2014   CLINICAL DATA:  Loss of consciousness. Syncope yesterday. Continued dizziness.  EXAM: CT HEAD WITHOUT CONTRAST  TECHNIQUE: Contiguous axial images were obtained from the base of the skull through the vertex without intravenous contrast.  COMPARISON:  None.  FINDINGS: Patchy low-density throughout the deep white matter, most compatible with chronic microvascular disease. Mild age related volume loss. No acute intracranial abnormality. Specifically, no hemorrhage, hydrocephalus, mass lesion, acute infarction, or significant intracranial injury. No acute  calvarial abnormality. Visualized paranasal sinuses and mastoids clear. Orbital soft tissues unremarkable.  IMPRESSION: No acute intracranial abnormality.   Electronically Signed   By: Rolm Baptise M.D.   On: 07/21/2014 14:31    Microbiology: No results found for this or any previous visit (from the past 240 hour(s)).   Labs: Basic Metabolic Panel:  Recent Labs Lab 07/21/14 1238  NA 139  K 3.8  CL 102  CO2 25  GLUCOSE 78  BUN 7  CREATININE 0.62  CALCIUM 9.8   Liver Function Tests:  Recent Labs Lab 07/21/14 1402  AST 18  ALT 14  ALKPHOS 55  BILITOT 0.6  PROT 7.4  ALBUMIN 4.3   No results for input(s): LIPASE, AMYLASE in the last 168 hours. No results for input(s): AMMONIA in the last 168 hours. CBC:  Recent Labs Lab 07/21/14 1238  WBC 9.3  HGB 14.5  HCT 43.5  MCV 86.7  PLT 277   Cardiac Enzymes:  Recent Labs Lab 07/21/14 1402  TROPONINI <0.03   BNP: BNP (last 3 results) No results for input(s): BNP in the last 8760 hours.  ProBNP (last 3 results) No results for input(s): PROBNP in the last 8760 hours.  CBG:  Recent Labs Lab 07/22/14 0600  GLUCAP 110*       SignedDebbe Odea, MD [facility] 07/22/2014, 5:59 PM

## 2014-07-22 NOTE — Progress Notes (Signed)
UR completed 

## 2014-07-27 NOTE — Telephone Encounter (Signed)
Pt admitted to hospital on 5.3.16.

## 2014-08-02 ENCOUNTER — Other Ambulatory Visit: Payer: Self-pay | Admitting: Internal Medicine

## 2014-08-06 ENCOUNTER — Other Ambulatory Visit: Payer: Self-pay | Admitting: Internal Medicine

## 2014-08-06 ENCOUNTER — Other Ambulatory Visit: Payer: Self-pay | Admitting: *Deleted

## 2014-08-06 MED ORDER — QUINAPRIL HCL 20 MG PO TABS: 20.0000 mg | ORAL_TABLET | Freq: Every day | ORAL | Status: DC

## 2014-08-06 MED ORDER — SIMVASTATIN 20 MG PO TABS: 20.0000 mg | ORAL_TABLET | Freq: Every evening | ORAL | Status: DC

## 2014-08-06 MED ORDER — HYDROCHLOROTHIAZIDE 25 MG PO TABS: 25.0000 mg | ORAL_TABLET | Freq: Every day | ORAL | Status: DC

## 2014-08-06 NOTE — Telephone Encounter (Signed)
Fax from pharmacy, requesting 90 day supply 

## 2014-09-04 ENCOUNTER — Encounter: Payer: PPO | Admitting: Internal Medicine

## 2014-11-05 ENCOUNTER — Other Ambulatory Visit: Payer: Self-pay | Admitting: Internal Medicine

## 2014-11-05 NOTE — Telephone Encounter (Signed)
Last OV 4.25.16 please advise refill

## 2014-11-06 ENCOUNTER — Other Ambulatory Visit: Payer: Self-pay | Admitting: Internal Medicine

## 2014-11-06 NOTE — Telephone Encounter (Signed)
Ok to refill,  printed rx  

## 2014-11-06 NOTE — Telephone Encounter (Signed)
Last OV 6.17.16.  Last refill 2.5.16.   Please advise refill

## 2014-11-09 NOTE — Telephone Encounter (Signed)
Ok to refill,  printed rx  

## 2014-11-09 NOTE — Telephone Encounter (Signed)
Script faxed.

## 2015-01-13 ENCOUNTER — Other Ambulatory Visit (INDEPENDENT_AMBULATORY_CARE_PROVIDER_SITE_OTHER): Payer: PPO

## 2015-01-13 ENCOUNTER — Telehealth: Payer: Self-pay | Admitting: *Deleted

## 2015-01-13 DIAGNOSIS — I10 Essential (primary) hypertension: Secondary | ICD-10-CM

## 2015-01-13 DIAGNOSIS — I700 Atherosclerosis of aorta: Secondary | ICD-10-CM | POA: Diagnosis not present

## 2015-01-13 LAB — LIPID PANEL
CHOL/HDL RATIO: 3
CHOLESTEROL: 151 mg/dL (ref 0–200)
HDL: 50.8 mg/dL (ref >39.00)
LDL Cholesterol: 82 mg/dL (ref 0–99)
NonHDL: 99.87
TRIGLYCERIDES: 91 mg/dL (ref 0.0–149.0)
VLDL: 18.2 mg/dL (ref 0.0–40.0)

## 2015-01-13 LAB — COMPREHENSIVE METABOLIC PANEL
ALT: 15 U/L (ref 0–35)
AST: 19 U/L (ref 0–37)
Albumin: 4.6 g/dL (ref 3.5–5.2)
Alkaline Phosphatase: 48 U/L (ref 39–117)
BILIRUBIN TOTAL: 0.8 mg/dL (ref 0.2–1.2)
BUN: 12 mg/dL (ref 6–23)
CALCIUM: 10 mg/dL (ref 8.4–10.5)
CO2: 31 meq/L (ref 19–32)
CREATININE: 0.72 mg/dL (ref 0.40–1.20)
Chloride: 100 mEq/L (ref 96–112)
GFR: 83.58 mL/min (ref >60.00)
Glucose, Bld: 111 mg/dL — ABNORMAL HIGH (ref 70–99)
Potassium: 3.8 mEq/L (ref 3.5–5.1)
SODIUM: 141 meq/L (ref 135–145)
Total Protein: 7.1 g/dL (ref 6.0–8.3)

## 2015-01-13 NOTE — Telephone Encounter (Signed)
Labs and dx?  

## 2015-01-18 ENCOUNTER — Encounter: Payer: Self-pay | Admitting: *Deleted

## 2015-01-19 ENCOUNTER — Other Ambulatory Visit: Payer: Self-pay

## 2015-01-19 MED ORDER — SIMVASTATIN 20 MG PO TABS: 20.0000 mg | ORAL_TABLET | Freq: Every evening | ORAL | Status: DC

## 2015-01-21 ENCOUNTER — Telehealth: Payer: Self-pay | Admitting: *Deleted

## 2015-01-21 ENCOUNTER — Telehealth: Payer: Self-pay | Admitting: Internal Medicine

## 2015-01-21 MED ORDER — CYCLOBENZAPRINE HCL 10 MG PO TABS: 10.0000 mg | ORAL_TABLET | Freq: Three times a day (TID) | ORAL | Status: DC | PRN

## 2015-01-21 NOTE — Telephone Encounter (Signed)
Attempted to call patient but was unable to reach. Left a voicemail for patient to return my call. Will route to Dr. Lupita Dawn CMA for future follow up.

## 2015-01-21 NOTE — Telephone Encounter (Signed)
Labs mailed 01-19-15.  Please advise on flexeril

## 2015-01-21 NOTE — Telephone Encounter (Signed)
Pt called to see if her lab results are in? Pt states she needs a prescription for Flexadril (Not sure of spelling) she had a one and needs another one it's for her back. Pharmacy is [facility]. Thank You!

## 2015-01-21 NOTE — Telephone Encounter (Signed)
Please read the message to her about her labs since she is asking.  flexeril refilled

## 2015-01-21 NOTE — Telephone Encounter (Signed)
Patient requested a call back for Lab results.-thanks

## 2015-01-21 NOTE — Telephone Encounter (Signed)
Called Patient LM that her labs were sent in the mail, but if she would like to discuss them to call back.  Advised Flexeril has been sent to the pharmacy.

## 2015-01-22 NOTE — Telephone Encounter (Signed)
Notified pt of lab results 

## 2015-01-29 ENCOUNTER — Telehealth: Payer: Self-pay | Admitting: Internal Medicine

## 2015-01-29 NOTE — Telephone Encounter (Signed)
Left msg for pt to call office to schedule AWV.msn °

## 2015-03-29 ENCOUNTER — Other Ambulatory Visit: Payer: Self-pay | Admitting: Internal Medicine

## 2015-06-09 ENCOUNTER — Other Ambulatory Visit: Payer: Self-pay | Admitting: Internal Medicine

## 2015-06-09 NOTE — Telephone Encounter (Signed)
Last OV 07/13/14...  Last filled on 11/09/14 #120, 0 refills... Okay to refill?

## 2015-06-11 NOTE — Telephone Encounter (Signed)
Refill denied,  Needs OV

## 2015-07-06 ENCOUNTER — Telehealth: Payer: Self-pay | Admitting: *Deleted

## 2015-07-06 ENCOUNTER — Other Ambulatory Visit (INDEPENDENT_AMBULATORY_CARE_PROVIDER_SITE_OTHER): Payer: PPO

## 2015-07-06 DIAGNOSIS — I10 Essential (primary) hypertension: Secondary | ICD-10-CM

## 2015-07-06 DIAGNOSIS — R7301 Impaired fasting glucose: Secondary | ICD-10-CM

## 2015-07-06 DIAGNOSIS — E876 Hypokalemia: Secondary | ICD-10-CM

## 2015-07-06 DIAGNOSIS — E559 Vitamin D deficiency, unspecified: Secondary | ICD-10-CM

## 2015-07-06 DIAGNOSIS — I700 Atherosclerosis of aorta: Secondary | ICD-10-CM

## 2015-07-06 DIAGNOSIS — E663 Overweight: Secondary | ICD-10-CM

## 2015-07-06 LAB — LIPID PANEL
Cholesterol: 157 mg/dL (ref 0–200)
HDL: 54.2 mg/dL (ref >39.00)
LDL Cholesterol: 83 mg/dL (ref 0–99)
NONHDL: 102.44
Total CHOL/HDL Ratio: 3
Triglycerides: 96 mg/dL (ref 0.0–149.0)
VLDL: 19.2 mg/dL (ref 0.0–40.0)

## 2015-07-06 LAB — COMPREHENSIVE METABOLIC PANEL
ALK PHOS: 46 U/L (ref 39–117)
ALT: 12 U/L (ref 0–35)
AST: 16 U/L (ref 0–37)
Albumin: 4.4 g/dL (ref 3.5–5.2)
BUN: 15 mg/dL (ref 6–23)
CHLORIDE: 101 meq/L (ref 96–112)
CO2: 30 mEq/L (ref 19–32)
CREATININE: 0.65 mg/dL (ref 0.40–1.20)
Calcium: 9.8 mg/dL (ref 8.4–10.5)
GFR: 93.93 mL/min (ref >60.00)
Glucose, Bld: 105 mg/dL — ABNORMAL HIGH (ref 70–99)
Potassium: 3.9 mEq/L (ref 3.5–5.1)
SODIUM: 140 meq/L (ref 135–145)
Total Bilirubin: 0.7 mg/dL (ref 0.2–1.2)
Total Protein: 6.8 g/dL (ref 6.0–8.3)

## 2015-07-06 LAB — MICROALBUMIN / CREATININE URINE RATIO
CREATININE, U: 194.9 mg/dL
MICROALB UR: 1.9 mg/dL (ref 0.0–1.9)
MICROALB/CREAT RATIO: 1 mg/g (ref 0.0–30.0)

## 2015-07-06 LAB — VITAMIN D 25 HYDROXY (VIT D DEFICIENCY, FRACTURES): VITD: 38.93 ng/mL (ref 30.00–100.00)

## 2015-07-06 LAB — HEMOGLOBIN A1C: HEMOGLOBIN A1C: 6.1 % (ref 4.6–6.5)

## 2015-07-06 LAB — LDL CHOLESTEROL, DIRECT: LDL DIRECT: 90 mg/dL

## 2015-07-06 NOTE — Telephone Encounter (Signed)
Labs and dx?  

## 2015-07-12 ENCOUNTER — Ambulatory Visit (INDEPENDENT_AMBULATORY_CARE_PROVIDER_SITE_OTHER): Payer: PPO | Admitting: Internal Medicine

## 2015-07-12 ENCOUNTER — Encounter: Payer: Self-pay | Admitting: Internal Medicine

## 2015-07-12 VITALS — BP 132/84 | HR 71 | Temp 97.9°F | Resp 12 | Ht 62.0 in | Wt 164.5 lb

## 2015-07-12 DIAGNOSIS — I10 Essential (primary) hypertension: Secondary | ICD-10-CM

## 2015-07-12 DIAGNOSIS — E785 Hyperlipidemia, unspecified: Secondary | ICD-10-CM | POA: Diagnosis not present

## 2015-07-12 DIAGNOSIS — M130 Polyarthritis, unspecified: Secondary | ICD-10-CM | POA: Diagnosis not present

## 2015-07-12 DIAGNOSIS — Z780 Asymptomatic menopausal state: Secondary | ICD-10-CM

## 2015-07-12 DIAGNOSIS — F09 Unspecified mental disorder due to known physiological condition: Secondary | ICD-10-CM

## 2015-07-12 DIAGNOSIS — R7301 Impaired fasting glucose: Secondary | ICD-10-CM

## 2015-07-12 DIAGNOSIS — R55 Syncope and collapse: Secondary | ICD-10-CM

## 2015-07-12 DIAGNOSIS — Z13820 Encounter for screening for osteoporosis: Secondary | ICD-10-CM

## 2015-07-12 LAB — COMPREHENSIVE METABOLIC PANEL
ALT: 11 U/L (ref 0–35)
AST: 15 U/L (ref 0–37)
Albumin: 4.3 g/dL (ref 3.5–5.2)
Alkaline Phosphatase: 43 U/L (ref 39–117)
BUN: 11 mg/dL (ref 6–23)
CALCIUM: 9.3 mg/dL (ref 8.4–10.5)
CHLORIDE: 105 meq/L (ref 96–112)
CO2: 27 mEq/L (ref 19–32)
CREATININE: 0.59 mg/dL (ref 0.40–1.20)
GFR: 105.04 mL/min (ref >60.00)
GLUCOSE: 82 mg/dL (ref 70–99)
Potassium: 4.2 mEq/L (ref 3.5–5.1)
SODIUM: 142 meq/L (ref 135–145)
Total Bilirubin: 0.4 mg/dL (ref 0.2–1.2)
Total Protein: 6.6 g/dL (ref 6.0–8.3)

## 2015-07-12 LAB — RHEUMATOID FACTOR: Rhuematoid fact SerPl-aCnc: <10 IU/mL (ref <=14)

## 2015-07-12 LAB — FERRITIN: FERRITIN: 24.5 ng/mL (ref 10.0–291.0)

## 2015-07-12 LAB — URIC ACID: Uric Acid, Serum: 5 mg/dL (ref 2.4–7.0)

## 2015-07-12 LAB — SEDIMENTATION RATE: Sed Rate: 9 mm/hr (ref 0–22)

## 2015-07-12 LAB — C-REACTIVE PROTEIN: CRP: 0.2 mg/dL — ABNORMAL LOW (ref 0.5–20.0)

## 2015-07-12 MED ORDER — TRAMADOL HCL 50 MG PO TABS: 50.0000 mg | ORAL_TABLET | Freq: Three times a day (TID) | ORAL | Status: AC | PRN

## 2015-07-12 NOTE — Progress Notes (Signed)
Subjective:  Patient ID: Lori Jimenez, female    DOB: 13-Oct-1940  Age: 75 y.o. MRN: [id number]  CC: The primary encounter diagnosis was Screening for osteoporosis. Diagnoses of Postmenopausal estrogen deficiency, Polyarthritis, Essential hypertension, Hyperlipidemia, Impaired fasting glucose, Syncope and collapse, and Acquired cognitive dysfunction were also pertinent to this visit.  HPI ARBUTUS NELLIGAN presents for follow up on subacute issues and  conditions .Last seen one year ago     Had a syncopal episode last May after a period of two weeks of frontal headaches.. After a second near syncopal event sh ewa taken to ED. Had orthostatic vital signs.  spent the night at [facility] CT done, ECHO done both negative for acute changes.  Iv fluids given and orthostatics resolved.  BP medications were not changed.   Has Felt foggy  Since then.  Ever since then has had trouble concentrating and remembering things  Frontal headache occurs about once a week  Saw [facility]   Joint pain.  Started after having the self diagnosed  flu in early March .  For the past 4-6 weeks has had diffuse myalgias .  Intense pain .  All joints involved.  Was using tramadol until she ran out and so she has been using Advil 200 mg every 5 or 6 hours  Left knee swelling and loss of strength occurred 3 years ago,  Now knee is  buckling .  Wants to see Dr. Marry Guan  Needs dermatology referral for forehead lesion and nasal lesion , two rough patches  She is not tolerating losartan/hct  Because she feels it is making her foggy .  Has stopped taking it. Not on any bp medications .  Has been asked to bring her BP kit to office  Urge incontinence since  October  .   IPG:   a1c now 6.1 weight gain of 6 lbs. Tried a low carb diet and lost weight on it.     Outpatient Prescriptions Prior to Visit  Medication Sig Dispense Refill  . aspirin 81 MG tablet Take 81 mg by mouth daily.    . cyclobenzaprine (FLEXERIL) 10 MG tablet Take 1  tablet (10 mg total) by mouth 3 (three) times daily as needed for muscle spasms. 90 tablet 1  . hydrochlorothiazide (HYDRODIURIL) 25 MG tablet Take 25 mg by mouth daily.    . hydrochlorothiazide (HYDRODIURIL) 25 MG tablet Take 1 tablet (25 mg total) by mouth daily. 90 tablet 1  . Probiotic Product (PROBIOTIC DAILY PO) Take 1 tablet by mouth daily.    . simvastatin (ZOCOR) 20 MG tablet Take 1 tablet (20 mg total) by mouth every evening. 90 tablet 1  . simvastatin (ZOCOR) 20 MG tablet TAKE 1 TABLET(20 MG) BY MOUTH EVERY EVENING 90 tablet 0  . traMADol (ULTRAM) 50 MG tablet Take 1 tablet (50 mg total) by mouth every 8 (eight) hours as needed for moderate pain. maximum two daily 120 tablet 0  . quinapril (ACCUPRIL) 20 MG tablet Take 1 tablet (20 mg total) by mouth daily. (Patient not taking: Reported on 07/12/2015) 90 tablet 1   No facility-administered medications prior to visit.    Review of Systems;  Patient denies headache, fevers, malaise, unintentional weight loss, skin rash, eye pain, sinus congestion and sinus pain, sore throat, dysphagia,  hemoptysis , cough, dyspnea, wheezing, chest pain, palpitations, orthopnea, edema, abdominal pain, nausea, melena, diarrhea, constipation, flank pain, dysuria, hematuria, urinary  Frequency, nocturia, numbness, tingling, seizures,  Focal weakness, Loss  of consciousness,  Tremor, insomnia, depression, anxiety, and suicidal ideation.      Objective:  BP 132/84 mmHg  Pulse 71  Temp(Src) 97.9 F (36.6 C) (Oral)  Resp 12  Ht 5' 2"  (1.575 m)  Wt 164 lb 8 oz (74.617 kg)  BMI 30.08 kg/m2  SpO2 95%  BP Readings from Last 3 Encounters:  07/12/15 132/84  07/22/14 139/67  07/13/14 126/88    Wt Readings from Last 3 Encounters:  07/12/15 164 lb 8 oz (74.617 kg)  07/22/14 156 lb 4.9 oz (70.9 kg)  07/13/14 155 lb 12 oz (70.648 kg)    General appearance: alert, cooperative and appears stated age Ears: normal TM's and external ear canals both  ears Throat: lips, mucosa, and tongue normal; teeth and gums normal Neck: no adenopathy, no carotid bruit, supple, symmetrical, trachea midline and thyroid not enlarged, symmetric, no tenderness/mass/nodules Back: symmetric, no curvature. ROM normal. No CVA tenderness. Lungs: clear to auscultation bilaterally Heart: regular rate and rhythm, S1, S2 normal, no murmur, click, rub or gallop Abdomen: soft, non-tender; bowel sounds normal; no masses,  no organomegaly Pulses: 2+ and symmetric Skin: Skin color, texture, turgor normal. No rashes or lesions Lymph nodes: Cervical, supraclavicular, and axillary nodes normal.  Lab Results  Component Value Date   HGBA1C 6.1 07/06/2015   HGBA1C 5.8 07/13/2014    Lab Results  Component Value Date   CREATININE 0.59 07/12/2015   CREATININE 0.65 07/06/2015   CREATININE 0.72 01/13/2015    Lab Results  Component Value Date   WBC 9.3 07/21/2014   HGB 14.5 07/21/2014   HCT 43.5 07/21/2014   PLT 277 07/21/2014   GLUCOSE 82 07/12/2015   CHOL 157 07/06/2015   TRIG 96.0 07/06/2015   HDL 54.20 07/06/2015   LDLDIRECT 90.0 07/06/2015   LDLCALC 83 07/06/2015   ALT 11 07/12/2015   AST 15 07/12/2015   NA 142 07/12/2015   K 4.2 07/12/2015   CL 105 07/12/2015   CREATININE 0.59 07/12/2015   BUN 11 07/12/2015   CO2 27 07/12/2015   TSH 2.537 07/21/2014   INR 1.09 07/21/2014   HGBA1C 6.1 07/06/2015   MICROALBUR 1.9 07/06/2015    Dg Chest 2 View  07/21/2014  CLINICAL DATA:  Syncope yesterday, fall, headache EXAM: CHEST  2 VIEW COMPARISON:  None. FINDINGS: Cardiomediastinal silhouette is unremarkable. No acute infiltrate or pleural effusion. No pulmonary edema. Thoracic spine osteopenia. IMPRESSION: No active cardiopulmonary disease. Electronically Signed   By: Lahoma Crocker M.D.   On: 07/21/2014 14:23   Ct Head Wo Contrast  07/21/2014  CLINICAL DATA:  Loss of consciousness. Syncope yesterday. Continued dizziness. EXAM: CT HEAD WITHOUT CONTRAST TECHNIQUE:  Contiguous axial images were obtained from the base of the skull through the vertex without intravenous contrast. COMPARISON:  None. FINDINGS: Patchy low-density throughout the deep white matter, most compatible with chronic microvascular disease. Mild age related volume loss. No acute intracranial abnormality. Specifically, no hemorrhage, hydrocephalus, mass lesion, acute infarction, or significant intracranial injury. No acute calvarial abnormality. Visualized paranasal sinuses and mastoids clear. Orbital soft tissues unremarkable. IMPRESSION: No acute intracranial abnormality. Electronically Signed   By: Rolm Baptise M.D.   On: 07/21/2014 14:31    Assessment & Plan:   Problem List Items Addressed This Visit    Essential hypertension    Currently normotensive without medications.  Previous syncopal event occurred on losartan/hct, Now just taking hct.       Relevant Medications   losartan-hydrochlorothiazide (HYZAAR) 100-25 MG tablet  Hyperlipidemia    Well controlled on current statin therapy.   Liver enzymes are normal , no changes today.  Lab Results  Component Value Date   CHOL 157 07/06/2015   HDL 54.20 07/06/2015   LDLCALC 83 07/06/2015   LDLDIRECT 90.0 07/06/2015   TRIG 96.0 07/06/2015   CHOLHDL 3 07/06/2015   Lab Results  Component Value Date   ALT 11 07/12/2015   AST 15 07/12/2015   ALKPHOS 43 07/12/2015   BILITOT 0.4 07/12/2015           Relevant Medications   losartan-hydrochlorothiazide (HYZAAR) 100-25 MG tablet   Impaired fasting glucose    She is prediabetic. I have addressed  BMI and recommended a low glycemic index diet utilizing smaller more frequent meals to increase metabolism.  I have also recommended that patient start exercising with a goal of 30 minutes of aerobic exercise a minimum of 5 days per week. Screening for lipid disorders,and diabetes to be done today.  Lab Results  Component Value Date   HGBA1C 6.1 07/06/2015         Syncope and  collapse    secondary to orthostasis which resolved after IV hydration       Relevant Medications   losartan-hydrochlorothiazide (HYZAAR) 100-25 MG tablet   Acquired cognitive dysfunction    She has reported decreased clarity since her  Syncopal event , suggesting that she suffered a concussion when she hit her head  .  She has no balance issues or incontinence to suggest NPH      Polyarthritis    Diffuse, and present for one month and symptoms started after a viral illness.  She has no signs   Of inflammation on exam or by serologic studies today.   Lab Results  Component Value Date   ESRSEDRATE 9 07/12/2015   Lab Results  Component Value Date   CRP 0.2* 07/12/2015   Lab Results  Component Value Date   RF <10 07/12/2015        Relevant Orders   Sedimentation rate (Completed)   Comprehensive metabolic panel (Completed)   Uric acid (Completed)   Rheumatoid factor (Completed)   C-reactive protein (Completed)   Ferritin (Completed)    Other Visit Diagnoses    Screening for osteoporosis    -  Primary    Postmenopausal estrogen deficiency        Relevant Orders    DG Bone Density       I am having Ms. Scharrer maintain her aspirin, hydrochlorothiazide, Probiotic Product (PROBIOTIC DAILY PO), quinapril, hydrochlorothiazide, simvastatin, cyclobenzaprine, simvastatin, losartan-hydrochlorothiazide, and traMADol.  Meds ordered this encounter  Medications  . losartan-hydrochlorothiazide (HYZAAR) 100-25 MG tablet    Sig: Take 1 tablet by mouth daily.    Refill:  3  . traMADol (ULTRAM) 50 MG tablet    Sig: Take 1 tablet (50 mg total) by mouth every 8 (eight) hours as needed for moderate pain. maximum two daily    Dispense:  120 tablet    Refill:  4    90 day supply   A total of 40 minutes was spent with patient more than half of which was spent in counseling patient on the above mentioned issues , reviewing and explaining recent labs and imaging studies done, and coordination  of care.  Medications Discontinued During This Encounter  Medication Reason  . traMADol (ULTRAM) 50 MG tablet Reorder    Follow-up: Return in about 3 months (around 10/11/2015).   Crecencio Mc, MD

## 2015-07-12 NOTE — Patient Instructions (Signed)
You are a "prediabetic."  This means you have higher than normal fasting and random sugars,  But do not need medications for diabets at this point  Following a Mediterranean, low glycemic index diet is the best diet for you and for most Americans  This is my  example of a  "Low GI"  Diet:  It will allow you to lose 4 to 8  lbs  per month if you follow it carefully.  Your goal with exercise is a minimum of 30 minutes of aerobic exercise 5 days per week (Walking does not count once it becomes easy!)    All of the foods can be found at grocery stores and in bulk at [employer].  The Atkins protein bars and shakes are available in more varieties at [employer], [employer] and [place].     7 AM Breakfast:  Choose from the following:  Low carbohydrate Protein  Shakes (I recommend the  Premier Protein chocolate shake, s EAS AdvantEdge "Carb Control" shakes  Or the low carb shakes by Atkins.    2.5 carbs)   Arnold's "Sandwhich Thin"toasted  w/ peanut butter (no jelly: about 20 net carbs  "Bagel Thin" with cream cheese and salmon: about 20 carbs   a scrambled egg/bacon/cheese burrito made with Mission's "carb balance" whole wheat tortilla  (about 10 net carbs )  Regulatory affairs officer (basically a quiche without the pastry crust) that is eaten cold and very convenient way to get your eggs  If you make your own shakes, avoid bananas and pineapple,  And use low carb greek yogurt or almond milk    Avoid cereal and bananas, oatmeal and cream of wheat and grits. They are loaded with carbohydrates!   10 AM: high protein snack:  Protein bar by Atkins (the snack size, under 200 cal, usually < 6 net carbs).    A stick of cheese:  Around 1 carb,  100 cal     Dannon Light n Fit [place] Yogurt  (80 cal, 8 carbs)  Other so called "protein bars" and Greek yogurts tend to be loaded with carbohydrates.  Remember, in food advertising, the word "energy" is synonymous for " carbohydrate."  Lunch:   A  Sandwich using the bread choices listed, Can use any  Eggs,  lunchmeat, grilled meat or canned tuna), avocado, regular mayo/mustard  and cheese.  A Salad using blue cheese, ranch,  Goddess or vinagrette,  Avoid taco shells, croutons or "confetti" and no "candied nuts" but regular nuts OK.   No pretzels, nabs  or chips.  Pickles and miniature sweet peppers are a good low carb alternative that provide a "crunch"  The bread is the only source of carbohydrate in a sandwich and  can be decreased by trying some of these alternatives to traditional loaf bread  Joseph's makes a pita bread and a flat bread that are 50 cal and 4 net carbs available at [place] and [place].  This can be toasted to use with hummous as well  Toufayan makes a low carb flatbread that's 100 cal and 9 net carbs available at [employer] and [employer]'s makes 2 sizes of  Low carb whole wheat tortilla  (The large one is 210 cal and 6 net carbs)  Ezekiel bread is a loaf bread sold in the frozen section of higher end grocery chains,  Very low carb  Avoid "Low fat dressings, as well as Barry Brunner and [place] dressings They are loaded with  sugar!   3 PM/ Mid day  Snack:  Consider  1 ounce of  almonds, walnuts, pistachios, pecans, peanuts,  Macadamia nuts or a nut medley.  Avoid "granola"; the dried cranberries and raisins are loaded with carbohydrates. Mixed nuts as long as there are no raisins,  cranberries or dried fruit.    Try the prosciutto/mozzarella cheese sticks by Fiorruci  In deli /backery section   High protein      6 PM  Dinner:     Meat/fowl/fish with a green salad, and either broccoli, cauliflower, green beans, spinach, brussel sprouts or  Lima beans. DO NOT BREAD THE PROTEIN!!      There is a low carb pasta by Dreamfield's that is acceptable and tastes great: only 5 digestible carbs/serving.( All grocery stores but BJs carry it )  Try Hurley Cisco Angelo's chicken piccata or chicken or eggplant parm over low carb pasta.(Lowes  and BJs)   Marjory Lies Sanchez's "Carnitas" (pulled pork, no sauce,  0 carbs) or his beef pot roast to make a dinner burrito (at [employer]J's)  Pesto over low carb pasta (bj's sells a good quality pesto in the center refrigerated section of the deli   Try satueeing  Cheral Marker with mushroooms  Whole wheat pasta is still full of digestible carbs and  Not as low in glycemic index as Dreamfield's.   Brown rice is still rice,  So skip the rice and noodles if you eat [place] or [place] (or at least limit to 1/2 cup)  9 PM snack :   Breyer's "low carb" fudgsicle or  ice cream bar (Carb Smart line), or  Weight Watcher's ice cream bar , or another "no sugar added" ice cream;  a serving of fresh berries/cherries with whipped cream   Cheese or DANNON'S LlGHT N FIT GREEK YOGURT  8 ounces of [place] unsweetened almond/cococunut milk    Treat yourself to a parfait made with whipped cream blueberiies, walnuts and vanilla greek yogurt  Avoid bananas, pineapple, grapes  and watermelon on a regular basis because they are high in sugar.  THINK OF THEM AS DESSERT  Remember that snack Substitutions should be less than 10 NET carbs per serving and meals < 20 carbs. Remember to subtract fiber grams to get the "net carbs."

## 2015-07-12 NOTE — Progress Notes (Signed)
Pre-visit discussion using our clinic review tool. No additional management support is needed unless otherwise documented below in the visit note.  

## 2015-07-13 ENCOUNTER — Encounter: Payer: Self-pay | Admitting: Internal Medicine

## 2015-07-14 DIAGNOSIS — M25552 Pain in left hip: Secondary | ICD-10-CM

## 2015-07-14 DIAGNOSIS — G8929 Other chronic pain: Secondary | ICD-10-CM | POA: Insufficient documentation

## 2015-07-14 DIAGNOSIS — F09 Unspecified mental disorder due to known physiological condition: Secondary | ICD-10-CM | POA: Insufficient documentation

## 2015-07-14 NOTE — Assessment & Plan Note (Signed)
She has reported decreased clarity since her  Syncopal event , suggesting that she suffered a concussion when she hit her head  .  She has no balance issues or incontinence to suggest NPH

## 2015-07-14 NOTE — Assessment & Plan Note (Signed)
secondary to orthostasis which resolved after IV hydration

## 2015-07-14 NOTE — Assessment & Plan Note (Signed)
Diffuse, and present for one month and symptoms started after a viral illness.  She has no signs   Of inflammation on exam or by serologic studies today.   Lab Results  Component Value Date   ESRSEDRATE 9 07/12/2015   Lab Results  Component Value Date   CRP 0.2* 07/12/2015   Lab Results  Component Value Date   RF <10 07/12/2015

## 2015-07-14 NOTE — Assessment & Plan Note (Signed)
Currently normotensive without medications.  Previous syncopal event occurred on losartan/hct, Now just taking hct.

## 2015-07-14 NOTE — Assessment & Plan Note (Signed)
She is prediabetic. I have addressed  BMI and recommended a low glycemic index diet utilizing smaller more frequent meals to increase metabolism.  I have also recommended that patient start exercising with a goal of 30 minutes of aerobic exercise a minimum of 5 days per week. Screening for lipid disorders,and diabetes to be done today.  Lab Results  Component Value Date   HGBA1C 6.1 07/06/2015

## 2015-07-14 NOTE — Assessment & Plan Note (Signed)
Well controlled on current statin therapy.   Liver enzymes are normal , no changes today.  Lab Results  Component Value Date   CHOL 157 07/06/2015   HDL 54.20 07/06/2015   LDLCALC 83 07/06/2015   LDLDIRECT 90.0 07/06/2015   TRIG 96.0 07/06/2015   CHOLHDL 3 07/06/2015   Lab Results  Component Value Date   ALT 11 07/12/2015   AST 15 07/12/2015   ALKPHOS 43 07/12/2015   BILITOT 0.4 07/12/2015

## 2015-07-15 ENCOUNTER — Ambulatory Visit: Payer: PPO | Admitting: Internal Medicine

## 2015-07-16 ENCOUNTER — Telehealth: Payer: Self-pay

## 2015-07-16 NOTE — Telephone Encounter (Signed)
PA started to Cyclobenzaprine on Cover my meds

## 2015-07-19 NOTE — Telephone Encounter (Signed)
PA approved from 07/16/2015-03/19/2016.

## 2015-07-28 ENCOUNTER — Ambulatory Visit
Admission: RE | Admit: 2015-07-28 | Discharge: 2015-07-28 | Disposition: A | Discharge status: Home / Self Care | Payer: PPO | Source: Ambulatory Visit | Attending: Internal Medicine | Admitting: Internal Medicine

## 2015-07-28 DIAGNOSIS — M8580 Other specified disorders of bone density and structure, unspecified site: Secondary | ICD-10-CM | POA: Diagnosis not present

## 2015-07-28 DIAGNOSIS — Z13820 Encounter for screening for osteoporosis: Secondary | ICD-10-CM | POA: Insufficient documentation

## 2015-07-28 DIAGNOSIS — Z780 Asymptomatic menopausal state: Secondary | ICD-10-CM

## 2015-07-28 DIAGNOSIS — M8588 Other specified disorders of bone density and structure, other site: Secondary | ICD-10-CM | POA: Diagnosis not present

## 2015-08-06 ENCOUNTER — Telehealth: Payer: Self-pay | Admitting: *Deleted

## 2015-08-06 NOTE — Telephone Encounter (Signed)
Had mailed copy to patient on 4/27, remailed letter today. thanks

## 2015-08-06 NOTE — Telephone Encounter (Signed)
Patient has requested to have her labs from 04/24 mailed to her

## 2015-08-11 ENCOUNTER — Other Ambulatory Visit: Payer: Self-pay | Admitting: Internal Medicine

## 2015-08-30 ENCOUNTER — Telehealth: Payer: Self-pay | Admitting: Internal Medicine

## 2015-08-30 NOTE — Telephone Encounter (Signed)
Pt lvm stating that she has a lot of congestion. It started with a sore throat and has turned into a "hacky" cough and sounds like a bark. She has been taking OTC cough syrup but it is not helping. She feels that her chest has a tight feeling. Wants Dr. Derrel Nip to send in a rx for cough syrup to [facility] in [place].

## 2015-08-30 NOTE — Telephone Encounter (Signed)
Can you call patient and get more information fever , chills and schedule with NP tomorrow if needed.

## 2015-08-30 NOTE — Telephone Encounter (Signed)
Patient states that it started with a sore throat on Wednesday.  The sore throat is gone but now she has head congestion, drainage, and cough that is productive - white in color.  Appointment made for 08/31/15.

## 2015-08-31 ENCOUNTER — Ambulatory Visit (INDEPENDENT_AMBULATORY_CARE_PROVIDER_SITE_OTHER): Payer: PPO | Admitting: Family

## 2015-08-31 ENCOUNTER — Encounter: Payer: Self-pay | Admitting: Family

## 2015-08-31 VITALS — BP 130/90 | HR 97 | Temp 98.6°F | Wt 161.0 lb

## 2015-08-31 DIAGNOSIS — R059 Cough, unspecified: Secondary | ICD-10-CM

## 2015-08-31 DIAGNOSIS — R05 Cough: Secondary | ICD-10-CM

## 2015-08-31 NOTE — Progress Notes (Signed)
Subjective:    Patient ID: Lori Jimenez, female    DOB: 06/06/1940, 75 y.o.   MRN: [id number]   Lori Jimenez is a 76 y.o. female who presents today for an acute visit.    HPI Comments: Patient here for evaluation of cough for 6 days, 'much better'. Mainly here to ensure she is better and doesn't have PNA. She had some chest tightness along sides of ribs, nasal congestion which have resolved as well. No sob, wheezing. She had been using Afrin and cough syrup with relief. H/o seasonal allergies.    Past Medical History  Diagnosis Date  . Hypertension   . Hyperlipidemia   . Chronic lower back pain     "degenerative disc"  . Syncope and collapse 07/20/2014   Allergies: Benadryl and Oxycodone Current Outpatient Prescriptions on File Prior to Visit  Medication Sig Dispense Refill  . aspirin 81 MG tablet Take 81 mg by mouth daily.    . cyclobenzaprine (FLEXERIL) 10 MG tablet Take 1 tablet (10 mg total) by mouth 3 (three) times daily as needed for muscle spasms. 90 tablet 1  . hydrochlorothiazide (HYDRODIURIL) 25 MG tablet Take 25 mg by mouth daily.    . hydrochlorothiazide (HYDRODIURIL) 25 MG tablet Take 1 tablet (25 mg total) by mouth daily. 90 tablet 1  . losartan-hydrochlorothiazide (HYZAAR) 100-25 MG tablet Take 1 tablet by mouth daily.  3  . Probiotic Product (PROBIOTIC DAILY PO) Take 1 tablet by mouth daily.    . quinapril (ACCUPRIL) 20 MG tablet Take 1 tablet (20 mg total) by mouth daily. 90 tablet 1  . simvastatin (ZOCOR) 20 MG tablet TAKE 1 TABLET(20 MG) BY MOUTH EVERY EVENING 90 tablet 0  . simvastatin (ZOCOR) 20 MG tablet TAKE 1 TABLET(20 MG) BY MOUTH EVERY EVENING 90 tablet 3  . traMADol (ULTRAM) 50 MG tablet Take 1 tablet (50 mg total) by mouth every 8 (eight) hours as needed for moderate pain. maximum two daily 120 tablet 4   No current facility-administered medications on file prior to visit.    Social History  Substance Use Topics  . Smoking status: Former Smoker --  0.25 packs/day for 1 years    Types: Cigarettes    Quit date: 08/02/1961  . Smokeless tobacco: Never Used     Comment: "smoked but did not inhale cigarettes"  . Alcohol Use: 1.2 oz/week    2 Glasses of wine per week    Review of Systems  Constitutional: Negative for fever and chills.  HENT: Negative for congestion, ear pain, sinus pressure and sore throat.   Respiratory: Negative for cough, chest tightness, shortness of breath and wheezing.   Cardiovascular: Negative for chest pain and palpitations.  Gastrointestinal: Negative for nausea and vomiting.  Neurological: Negative for headaches.      Objective:    BP 130/90 mmHg  Pulse 97  Temp(Src) 98.6 F (37 C) (Oral)  Wt 161 lb (73.029 kg)  SpO2 94%   Physical Exam  Constitutional: She appears well-developed and well-nourished.  HENT:  Head: Normocephalic and atraumatic.  Right Ear: Hearing, tympanic membrane, external ear and ear canal normal. No drainage, swelling or tenderness. No foreign bodies. Tympanic membrane is not erythematous and not bulging. No middle ear effusion. No decreased hearing is noted.  Left Ear: Hearing, tympanic membrane, external ear and ear canal normal. No drainage, swelling or tenderness. No foreign bodies. Tympanic membrane is not erythematous and not bulging.  No middle ear effusion. No decreased hearing is  noted.  Nose: Nose normal. No rhinorrhea. Right sinus exhibits no maxillary sinus tenderness and no frontal sinus tenderness. Left sinus exhibits no maxillary sinus tenderness and no frontal sinus tenderness.  Mouth/Throat: Uvula is midline, oropharynx is clear and moist and mucous membranes are normal. No oropharyngeal exudate, posterior oropharyngeal edema, posterior oropharyngeal erythema or tonsillar abscesses.  Eyes: Conjunctivae are normal.  Cardiovascular: Regular rhythm, normal heart sounds and normal pulses.   Pulmonary/Chest: Effort normal and breath sounds normal. She has no wheezes. She  has no rhonchi. She has no rales.  Lymphadenopathy:       Head (right side): No submental, no submandibular, no tonsillar, no preauricular, no posterior auricular and no occipital adenopathy present.       Head (left side): No submental, no submandibular, no tonsillar, no preauricular, no posterior auricular and no occipital adenopathy present.    She has no cervical adenopathy.  Neurological: She is alert.  Skin: Skin is warm and dry.  Psychiatric: She has a normal mood and affect. Her speech is normal and behavior is normal. Thought content normal.  Vitals reviewed.      Assessment & Plan:   1. Cough Resolved. No adventitious lung sounds. Afebrile. Provided reassurance to patient that she has no signs or symptoms to suggest PNA.     I am having Ms. Claborn maintain her aspirin, hydrochlorothiazide, Probiotic Product (PROBIOTIC DAILY PO), quinapril, hydrochlorothiazide, cyclobenzaprine, simvastatin, losartan-hydrochlorothiazide, traMADol, and simvastatin.   No orders of the defined types were placed in this encounter.     Start medications as prescribed and explained to patient on After Visit Summary ( AVS). Risks, benefits, and alternatives of the medications and treatment plan prescribed today were discussed, and patient expressed understanding.   Education regarding symptom management and diagnosis given to patient.   Follow-up:Plan follow-up and return precautions given if any worsening symptoms or change in condition.   Continue to follow with Crecencio Mc, MD for routine health maintenance.   Lori Jimenez and I agreed with plan.   Mable Paris, FNP

## 2015-08-31 NOTE — Patient Instructions (Signed)
Cloricidin HBP Saline nasal spray.   You look great and  Pleasure to meet you !   If there is no improvement in your symptoms, or if there is any worsening of symptoms, or if you have any additional concerns, please return for re-evaluation; or, if we are closed, consider going to the Emergency Room for evaluation if symptoms urgent.

## 2015-09-29 ENCOUNTER — Other Ambulatory Visit: Payer: Self-pay | Admitting: Internal Medicine

## 2015-10-11 ENCOUNTER — Ambulatory Visit (INDEPENDENT_AMBULATORY_CARE_PROVIDER_SITE_OTHER): Payer: PPO | Admitting: Internal Medicine

## 2015-10-11 ENCOUNTER — Encounter: Payer: Self-pay | Admitting: Internal Medicine

## 2015-10-11 DIAGNOSIS — I10 Essential (primary) hypertension: Secondary | ICD-10-CM

## 2015-10-11 DIAGNOSIS — R7301 Impaired fasting glucose: Secondary | ICD-10-CM | POA: Diagnosis not present

## 2015-10-11 DIAGNOSIS — M130 Polyarthritis, unspecified: Secondary | ICD-10-CM

## 2015-10-11 NOTE — Assessment & Plan Note (Addendum)
Bilateral hips,  Knees and unilateral ankle.  CRP and ESR were normal.  Plain films of hips deferred. Continue advil 400 mg bid and add tramadol in the evening

## 2015-10-11 NOTE — Assessment & Plan Note (Signed)
Well controlled on current regimen. Renal function stable, no changes today.  Lab Results  Component Value Date   CREATININE 0.59 07/12/2015   Lab Results  Component Value Date   NA 142 07/12/2015   K 4.2 07/12/2015   CL 105 07/12/2015   CO2 27 07/12/2015

## 2015-10-11 NOTE — Progress Notes (Signed)
Subjective:  Patient ID: Lori Jimenez, female    DOB: 06/04/1940  Age: 75 y.o. MRN: [id number]  CC: Diagnoses of Polyarthritis, Impaired fasting glucose, and Essential hypertension were pertinent to this visit.  HPI Lori Jimenez presents for follow up on multiple issues.  1) hypertension previously managed with losartan/hct . At last visit in April she had a syncopal event and recurrent nausea which she attributed to losartan/hct and was advised to take hct only.  Now back on losartan/hct and tolerating it withotu side effects.    2) Recent bronchitis which present with Cough with systemic illness  In June with diffuse body aches and subjective fevers.  Both have resolved  felt terrible for weeks  , had gained 10 lbs since illness.  Has not resumed walking due to current residence on [address] [place] .  Planning on moving in a few weeks.     3) Osteopenia: DEXA  from May 2017 reviewed.  [facility] in [place] was previous one, years ago,  Used alendronate for several years but stopped it when she hear it was not good for her heart.   Over 5 years ago, not sure how long she was on it. Left Hip was -2.0 in 2010 . There appears to be no change, and she is not interested in therapy.   4) Painful joints: prior workup negative serologies in April . Bilateral hip pain aggravated by sleeping on it . Using advil,  tramadol prn rarely, not daily .  Never taking it at night. .  Uses heating pad for knees   History of ankle fracture    Reviewed previous labs from April which were normal /neg for inflammation   Reviewed head CT from may with patient   Home readings 130/85 or lower.  Strong FH of atherosclerotic  disease: brother had CVA,father had AMI  Outpatient Medications Prior to Visit  Medication Sig Dispense Refill  . aspirin 81 MG tablet Take 81 mg by mouth daily.    . cyclobenzaprine (FLEXERIL) 10 MG tablet Take 1 tablet (10 mg total) by mouth 3 (three) times daily as needed for  muscle spasms. 90 tablet 1  . losartan-hydrochlorothiazide (HYZAAR) 100-25 MG tablet Take 1 tablet by mouth daily.  3  . Probiotic Product (PROBIOTIC DAILY PO) Take 1 tablet by mouth daily.    . simvastatin (ZOCOR) 20 MG tablet TAKE 1 TABLET(20 MG) BY MOUTH EVERY EVENING 90 tablet 3  . traMADol (ULTRAM) 50 MG tablet Take 1 tablet (50 mg total) by mouth every 8 (eight) hours as needed for moderate pain. maximum two daily 120 tablet 4  . hydrochlorothiazide (HYDRODIURIL) 25 MG tablet Take 25 mg by mouth daily.    . hydrochlorothiazide (HYDRODIURIL) 25 MG tablet Take 1 tablet (25 mg total) by mouth daily. 90 tablet 1  . losartan-hydrochlorothiazide (HYZAAR) 100-25 MG tablet TAKE 1 TABLET BY MOUTH DAILY 90 tablet 2  . quinapril (ACCUPRIL) 20 MG tablet Take 1 tablet (20 mg total) by mouth daily. 90 tablet 1  . simvastatin (ZOCOR) 20 MG tablet TAKE 1 TABLET(20 MG) BY MOUTH EVERY EVENING 90 tablet 0   No facility-administered medications prior to visit.     Review of Systems;  Patient denies headache, fevers, malaise, unintentional weight loss, skin rash, eye pain, sinus congestion and sinus pain, sore throat, dysphagia,  hemoptysis , cough, dyspnea, wheezing, chest pain, palpitations, orthopnea, edema, abdominal pain, nausea, melena, diarrhea, constipation, flank pain, dysuria, hematuria, urinary  Frequency, nocturia, numbness, tingling,  seizures,  Focal weakness, Loss of consciousness,  Tremor, insomnia, depression, anxiety, and suicidal ideation.      Objective:  BP 122/84 (BP Location: Left Arm, Patient Position: Sitting, Cuff Size: Normal)   Pulse 84   Temp 98.1 F (36.7 C) (Oral)   Resp 16   Wt 162 lb (73.5 kg)   BMI 29.63 kg/m   BP Readings from Last 3 Encounters:  10/11/15 122/84  08/31/15 130/90  07/12/15 132/84    Wt Readings from Last 3 Encounters:  10/11/15 162 lb (73.5 kg)  08/31/15 161 lb (73 kg)  07/12/15 164 lb 8 oz (74.6 kg)    General appearance: alert,  cooperative and appears stated age Ears: normal TM's and external ear canals both ears Throat: lips, mucosa, and tongue normal; teeth and gums normal Neck: no adenopathy, no carotid bruit, supple, symmetrical, trachea midline and thyroid not enlarged, symmetric, no tenderness/mass/nodules Back: symmetric, no curvature. ROM normal. No CVA tenderness. Lungs: clear to auscultation bilaterally Heart: regular rate and rhythm, S1, S2 normal, no murmur, click, rub or gallop Abdomen: soft, non-tender; bowel sounds normal; no masses,  no organomegaly Pulses: 2+ and symmetric Skin: Skin color, texture, turgor normal. No rashes or lesions Lymph nodes: Cervical, supraclavicular, and axillary nodes normal.  Lab Results  Component Value Date   HGBA1C 6.1 07/06/2015   HGBA1C 5.8 07/13/2014    Lab Results  Component Value Date   CREATININE 0.59 07/12/2015   CREATININE 0.65 07/06/2015   CREATININE 0.72 01/13/2015    Lab Results  Component Value Date   WBC 9.3 07/21/2014   HGB 14.5 07/21/2014   HCT 43.5 07/21/2014   PLT 277 07/21/2014   GLUCOSE 82 07/12/2015   CHOL 157 07/06/2015   TRIG 96.0 07/06/2015   HDL 54.20 07/06/2015   LDLDIRECT 90.0 07/06/2015   LDLCALC 83 07/06/2015   ALT 11 07/12/2015   AST 15 07/12/2015   NA 142 07/12/2015   K 4.2 07/12/2015   CL 105 07/12/2015   CREATININE 0.59 07/12/2015   BUN 11 07/12/2015   CO2 27 07/12/2015   TSH 2.537 07/21/2014   INR 1.09 07/21/2014   HGBA1C 6.1 07/06/2015   MICROALBUR 1.9 07/06/2015    Dg Bone Density  Result Date: 07/28/2015 EXAM: DUAL X-RAY ABSORPTIOMETRY (DXA) FOR BONE MINERAL DENSITY IMPRESSION: Dear Dr. Derrel Nip, Your patient Lori Jimenez completed a BMD test on 07/28/2015 using the [place] (analysis version: 14.10) manufactured by [employer]. The following summarizes the results of our evaluation. PATIENT BIOGRAPHICAL: Name: Lori, Jimenez Patient ID: [id number] Birth Date: 22-Oct-1940 Height: 60.7 in. Gender:  Female Exam Date: 07/28/2015 Weight: 161.4 lbs. Indications: Advanced Age, Caucasian, Height Loss, History of Fracture (Adult), Hysterectomy, Oophorectomy Bilateral, Postmenopausal Fractures: Right ankle Treatments: ASPRIN 81 MG, Calcium, simvastatin ASSESSMENT: The BMD measured at Femur Neck Right is 0.744 g/cm2 with a T-score of -2.1. This patient is considered osteopenic according to [facility]) criteria. L-3 & 4 was excluded due to degenerative changes. Site Region Measured Measured WHO Young Adult BMD Date       Age      Classification T-score AP Spine L1-L2 07/28/[age] Osteopenia -1.6 0.976 g/cm2 DualFemur Neck Right 07/28/2015 77.0 Osteopenia -2.1 0.744 g/cm2 World Health Organization [facility]) criteria for post-menopausal, Caucasian Women: Normal:       T-score at or above -1 SD Osteopenia:   T-score between -1 and -2.5 SD Osteoporosis: T-score at or below -2.5 SD RECOMMENDATIONS: [place] recommends that  FDA-approved medical therapies be considered in postmenopausal women and men age 11 or older with a: 1. Hip or vertebral (clinical or morphometric) fracture. 2. T-score of < -2.5 at the spine or hip. 3. Ten-year fracture probability by FRAX of 3% or greater for hip fracture or 20% or greater for major osteoporotic fracture. All treatment decisions require clinical judgment and consideration of individual patient factors, including patient preferences, co-morbidities, previous drug use, risk factors not captured in the FRAX model (e.g. falls, vitamin D deficiency, increased bone turnover, interval significant decline in bone density) and possible under - or over-estimation of fracture risk by FRAX. All patients should ensure an adequate intake of dietary calcium (1200 mg/d) and vitamin D (800 IU daily) unless contraindicated. FOLLOW-UP: People with diagnosed cases of osteoporosis or at high risk for fracture should have regular bone mineral density tests. For  patients eligible for Medicare, routine testing is allowed once every 2 years. The testing frequency can be increased to one year for patients who have rapidly progressing disease, those who are receiving or discontinuing medical therapy to restore bone mass, or have additional risk factors. I have reviewed this report, and agree with the above findings. [facility] Dear Dr. Derrel Nip, Your patient ILITHYIA TITZER completed a FRAX assessment on 07/28/2015 using the [place] (analysis version: 14.10) manufactured by [employer]. The following summarizes the results of our evaluation. PATIENT BIOGRAPHICAL: Name: Ahuva, Poynor Patient ID: [id number] Birth Date: March 26, 1940 Height:    60.7 in. Gender:     [age]    [age]       Weight:    161.4 lbs. Ethnicity:  White                            Exam Date: 07/28/2015 FRAX* RESULTS:  (version: 3.5) 10-year Probability of Fracture1 Major Osteoporotic Fracture2 Hip Fracture 21.3% 5.6% Population: [place] (Caucasian) Risk Factors: History of Fracture (Adult) Based on Femur (Right) Neck BMD 1 -The 10-year probability of fracture may be lower than reported if the patient has received treatment. 2 -Major Osteoporotic Fracture: Clinical Spine, Forearm, Hip or Shoulder *FRAX is a Materials engineer of the [employer] for Metabolic Bone Disease, a [place] (WHO) [facility]. ASSESSMENT: The probability of a major osteoporotic fracture is 21.3% within the next ten years. The probability of a hip fracture is 5.6% within the next ten years. . Electronically Signed   By: Rolm Baptise M.D.   On: 07/28/2015 11:22    Assessment & Plan:   Problem List Items Addressed This Visit    Essential hypertension    Well controlled on current regimen. Renal function stable, no changes today.  Lab Results  Component Value Date   CREATININE 0.59 07/12/2015   Lab Results  Component Value Date   NA 142  07/12/2015   K 4.2 07/12/2015   CL 105 07/12/2015   CO2 27 07/12/2015         Impaired fasting glucose    She is prediabetic. I have addressed  BMI and recommended a low glycemic index diet utilizing smaller more frequent meals to increase metabolism.  I have also recommended that patient start exercising with a goal of 30 minutes of aerobic exercise a minimum of 5 days per week. Screening for lipid disorders,and diabetes to be done today.  Lab Results  Component Value Date  HGBA1C 6.1 07/06/2015         Polyarthritis    Bilateral hips,  Knees and unilateral ankle.  CRP and ESR were normal.  Plain films of hips deferred. Continue advil 400 mg bid and add tramadol in the evening        Other Visit Diagnoses   None.     I have discontinued Ms. Rishel's hydrochlorothiazide, quinapril, and hydrochlorothiazide. I am also having her maintain her aspirin, Probiotic Product (PROBIOTIC DAILY PO), cyclobenzaprine, losartan-hydrochlorothiazide, traMADol, and simvastatin.  No orders of the defined types were placed in this encounter. A total of 40 minutes was spent with patient more than half of which was spent in counseling patient on the above mentioned issues , reviewing and explaining recent labs and imaging studies done, and coordination of care.  Medications Discontinued During This Encounter  Medication Reason  . hydrochlorothiazide (HYDRODIURIL) 25 MG tablet Change in therapy  . hydrochlorothiazide (HYDRODIURIL) 25 MG tablet Duplicate  . losartan-hydrochlorothiazide (HYZAAR) 444-58 MG tablet Duplicate  . quinapril (ACCUPRIL) 20 MG tablet Change in therapy  . simvastatin (ZOCOR) 20 MG tablet Duplicate    Follow-up: Return in about 3 months (around 01/11/2016) for fasting labs late october .   Crecencio Mc, MD

## 2015-10-11 NOTE — Assessment & Plan Note (Signed)
She is prediabetic. I have addressed  BMI and recommended a low glycemic index diet utilizing smaller more frequent meals to increase metabolism.  I have also recommended that patient start exercising with a goal of 30 minutes of aerobic exercise a minimum of 5 days per week. Screening for lipid disorders,and diabetes to be done today.  Lab Results  Component Value Date   HGBA1C 6.1 07/06/2015

## 2015-10-11 NOTE — Patient Instructions (Addendum)
You can take 2 to  advil up to 2  times daily if needed for your joint pain   You can use tramadol up to two times daily as well,    Mostly uses at night  To help quiret the joint pain so you can sleep.    Consider gastritis as a cause of occasional nausea. If this occurs again stop the advil but continue tramadol, and tylenol  Can be added up to 4 daily      I advise you to try using  (Pepcid ) famotidine 20 mg once or twice daily,  or to  (Zantac) ranitidine 150 mg once or twice daily.  These medications are  H2 blockers and are available without a prescriptions.      Please return in late October for fasting labs

## 2015-11-30 ENCOUNTER — Telehealth: Payer: Self-pay | Admitting: *Deleted

## 2015-11-30 ENCOUNTER — Telehealth: Payer: Self-pay | Admitting: Internal Medicine

## 2015-11-30 DIAGNOSIS — E785 Hyperlipidemia, unspecified: Secondary | ICD-10-CM

## 2015-11-30 DIAGNOSIS — R7301 Impaired fasting glucose: Secondary | ICD-10-CM

## 2015-11-30 NOTE — Telephone Encounter (Signed)
Please advise and order, last lipid panel was in 07/06/2015. thanks

## 2015-11-30 NOTE — Telephone Encounter (Signed)
Ok. Pt is scheduled. Thank you! °

## 2015-11-30 NOTE — Telephone Encounter (Signed)
Pt has requested to have a lab panel ordered for her to continue her simvastatin.  Pt contact [phone]

## 2015-11-30 NOTE — Telephone Encounter (Signed)
Can be scheduled now, thanks

## 2015-11-30 NOTE — Telephone Encounter (Signed)
Pt would like to get labs done. Need orders please and thank you!

## 2015-11-30 NOTE — Telephone Encounter (Signed)
Nonfasting labs ordered

## 2015-11-30 NOTE — Telephone Encounter (Signed)
Please advise and order if needed, last labs were 07/12/2015.  thanks

## 2015-12-14 ENCOUNTER — Other Ambulatory Visit: Payer: Self-pay | Admitting: Internal Medicine

## 2015-12-14 ENCOUNTER — Ambulatory Visit (INDEPENDENT_AMBULATORY_CARE_PROVIDER_SITE_OTHER): Payer: PPO

## 2015-12-14 ENCOUNTER — Ambulatory Visit: Payer: PPO

## 2015-12-14 ENCOUNTER — Other Ambulatory Visit (INDEPENDENT_AMBULATORY_CARE_PROVIDER_SITE_OTHER): Payer: PPO

## 2015-12-14 VITALS — BP 130/70 | HR 87 | Temp 98.1°F | Resp 12 | Ht 62.0 in | Wt 163.8 lb

## 2015-12-14 DIAGNOSIS — D492 Neoplasm of unspecified behavior of bone, soft tissue, and skin: Secondary | ICD-10-CM

## 2015-12-14 DIAGNOSIS — R7301 Impaired fasting glucose: Secondary | ICD-10-CM | POA: Diagnosis not present

## 2015-12-14 DIAGNOSIS — Z0000 Encounter for general adult medical examination without abnormal findings: Secondary | ICD-10-CM | POA: Diagnosis not present

## 2015-12-14 DIAGNOSIS — E785 Hyperlipidemia, unspecified: Secondary | ICD-10-CM

## 2015-12-14 DIAGNOSIS — Z23 Encounter for immunization: Secondary | ICD-10-CM

## 2015-12-14 DIAGNOSIS — L989 Disorder of the skin and subcutaneous tissue, unspecified: Secondary | ICD-10-CM | POA: Diagnosis not present

## 2015-12-14 LAB — COMPREHENSIVE METABOLIC PANEL
ALBUMIN: 4.2 g/dL (ref 3.5–5.2)
ALK PHOS: 59 U/L (ref 39–117)
ALT: 14 U/L (ref 0–35)
AST: 16 U/L (ref 0–37)
BILIRUBIN TOTAL: 0.5 mg/dL (ref 0.2–1.2)
BUN: 11 mg/dL (ref 6–23)
CALCIUM: 9.7 mg/dL (ref 8.4–10.5)
CO2: 34 mEq/L — ABNORMAL HIGH (ref 19–32)
Chloride: 98 mEq/L (ref 96–112)
Creatinine, Ser: 0.68 mg/dL (ref 0.40–1.20)
GFR: 89.06 mL/min (ref >60.00)
GLUCOSE: 105 mg/dL — AB (ref 70–99)
Potassium: 3.7 mEq/L (ref 3.5–5.1)
Sodium: 139 mEq/L (ref 135–145)
TOTAL PROTEIN: 7.1 g/dL (ref 6.0–8.3)

## 2015-12-14 LAB — HEMOGLOBIN A1C: HEMOGLOBIN A1C: 5.9 % (ref 4.6–6.5)

## 2015-12-14 LAB — LDL CHOLESTEROL, DIRECT: LDL DIRECT: 83 mg/dL

## 2015-12-14 NOTE — Progress Notes (Signed)
  I have reviewed the above information and agree with above. Dermatology referral is in process.   Deborra Medina, MD

## 2015-12-14 NOTE — Patient Instructions (Addendum)
Lori Jimenez , Thank you for taking time to come for your Medicare Wellness Visit. I appreciate your ongoing commitment to your health goals. Please review the following plan we discussed and let me know if I can assist you in the future.   Follow up with Dr. Derrel Nip as needed.  We will contact you regarding referral or appointment needed for mole on face.  Complete and return HCPOA/Living Will short forms.  These are the goals we discussed: Goals    . Healthy Lifestyle          Stay hydrated and drink plenty of fluids. Stay active and continue exercise regimen. Low carb foods.  Lean meats, vegetables.       This is a list of the screening recommended for you and due dates:  Health Maintenance  Topic Date Due  . Tetanus Vaccine  07/19/2022  . Flu Shot  Completed  . DEXA scan (bone density measurement)  Completed  . Shingles Vaccine  Completed  . Pneumonia vaccines  Completed   Fall Prevention in the Home  Falls can cause injuries. They can happen to people of all ages. There are many things you can do to make your home safe and to help prevent falls.  WHAT CAN I DO ON THE OUTSIDE OF MY HOME?  Regularly fix the edges of walkways and driveways and fix any cracks.  Remove anything that might make you trip as you walk through a door, such as a raised step or threshold.  Trim any bushes or trees on the path to your home.  Use bright outdoor lighting.  Clear any walking paths of anything that might make someone trip, such as rocks or tools.  Regularly check to see if handrails are loose or broken. Make sure that both sides of any steps have handrails.  Any raised decks and porches should have guardrails on the edges.  Have any leaves, snow, or ice cleared regularly.  Use sand or salt on walking paths during winter.  Clean up any spills in your garage right away. This includes oil or grease spills. WHAT CAN I DO IN THE BATHROOM?   Use night lights.  Install grab bars  by the toilet and in the tub and shower. Do not use towel bars as grab bars.  Use non-skid mats or decals in the tub or shower.  If you need to sit down in the shower, use a plastic, non-slip stool.  Keep the floor dry. Clean up any water that spills on the floor as soon as it happens.  Remove soap buildup in the tub or shower regularly.  Attach bath mats securely with double-sided non-slip rug tape.  Do not have throw rugs and other things on the floor that can make you trip. WHAT CAN I DO IN THE BEDROOM?  Use night lights.  Make sure that you have a light by your bed that is easy to reach.  Do not use any sheets or blankets that are too big for your bed. They should not hang down onto the floor.  Have a firm chair that has side arms. You can use this for support while you get dressed.  Do not have throw rugs and other things on the floor that can make you trip. WHAT CAN I DO IN THE KITCHEN?  Clean up any spills right away.  Avoid walking on wet floors.  Keep items that you use a lot in easy-to-reach places.  If you need to reach  something above you, use a strong step stool that has a grab bar.  Keep electrical cords out of the way.  Do not use floor polish or wax that makes floors slippery. If you must use wax, use non-skid floor wax.  Do not have throw rugs and other things on the floor that can make you trip. WHAT CAN I DO WITH MY STAIRS?  Do not leave any items on the stairs.  Make sure that there are handrails on both sides of the stairs and use them. Fix handrails that are broken or loose. Make sure that handrails are as long as the stairways.  Check any carpeting to make sure that it is firmly attached to the stairs. Fix any carpet that is loose or worn.  Avoid having throw rugs at the top or bottom of the stairs. If you do have throw rugs, attach them to the floor with carpet tape.  Make sure that you have a light switch at the top of the stairs and the  bottom of the stairs. If you do not have them, ask someone to add them for you. WHAT ELSE CAN I DO TO HELP PREVENT FALLS?  Wear shoes that:  Do not have high heels.  Have rubber bottoms.  Are comfortable and fit you well.  Are closed at the toe. Do not wear sandals.  If you use a stepladder:  Make sure that it is fully opened. Do not climb a closed stepladder.  Make sure that both sides of the stepladder are locked into place.  Ask someone to hold it for you, if possible.  Clearly mark and make sure that you can see:  Any grab bars or handrails.  First and last steps.  Where the edge of each step is.  Use tools that help you move around (mobility aids) if they are needed. These include:  Canes.  Walkers.  Scooters.  Crutches.  Turn on the lights when you go into a dark area. Replace any light bulbs as soon as they burn out.  Set up your furniture so you have a clear path. Avoid moving your furniture around.  If any of your floors are uneven, fix them.  If there are any pets around you, be aware of where they are.  Review your medicines with your doctor. Some medicines can make you feel dizzy. This can increase your chance of falling. Ask your doctor what other things that you can do to help prevent falls.   This information is not intended to replace advice given to you by your health care provider. Make sure you discuss any questions you have with your health care provider.   Document Released: 12/31/2008 Document Revised: 07/21/2014 Document Reviewed: 04/10/2014 Elsevier Interactive Patient Education [employer].

## 2015-12-14 NOTE — Progress Notes (Signed)
Subjective:   Lori Jimenez is a 75 y.o. female who presents for Medicare Annual (Subsequent) preventive examination.  Review of Systems:  No ROS.  Medicare Wellness Visit.  Cardiac Risk Factors include: advanced age (>32men, >78 women);hypertension     Objective:     Vitals: BP 130/70 (BP Location: Left Arm, Patient Position: Sitting, Cuff Size: Normal)   Pulse 87   Temp 98.1 F (36.7 C) (Oral)   Resp 12   Ht 5\' 2"  (1.575 m)   Wt 163 lb 12.8 oz (74.3 kg)   SpO2 97%   BMI 29.96 kg/m   Body mass index is 29.96 kg/m.   Tobacco History  Smoking Status  . Former Smoker  . Packs/day: 0.25  . Years: 1.00  . Types: Cigarettes  . Quit date: 08/02/1961  Smokeless Tobacco  . Never Used    Comment: "smoked but did not inhale cigarettes"     Counseling given: Not Answered   Past Medical History:  Diagnosis Date  . Chronic lower back pain    "degenerative disc"  . Hyperlipidemia   . Hypertension   . Syncope and collapse 07/20/2014   Past Surgical History:  Procedure Laterality Date  . BLADDER SUSPENSION  2007  . CALCANEUS HARDWARE REMOVAL  2002  . FRACTURE SURGERY    . PERCUTANEOUS PINNING CALCANEAL FRACTURE  2001  . TONSILLECTOMY  ~ 1955  . VAGINAL HYSTERECTOMY  2006   "due to bladder prolapse"   Family History  Problem Relation Age of Onset  . Heart disease Father     Heart attack  . Stroke Brother   . Heart disease Paternal Grandmother     Heart attack   History  Sexual Activity  . Sexual activity: No    Outpatient Encounter Prescriptions as of 12/14/2015  Medication Sig  . aspirin 81 MG tablet Take 81 mg by mouth daily.  . cyclobenzaprine (FLEXERIL) 10 MG tablet Take 1 tablet (10 mg total) by mouth 3 (three) times daily as needed for muscle spasms.  Marland Kitchen losartan-hydrochlorothiazide (HYZAAR) 100-25 MG tablet Take 1 tablet by mouth daily.  . Probiotic Product (PROBIOTIC DAILY PO) Take 1 tablet by mouth daily.  . simvastatin (ZOCOR) 20 MG tablet TAKE 1  TABLET(20 MG) BY MOUTH EVERY EVENING  . traMADol (ULTRAM) 50 MG tablet Take 1 tablet (50 mg total) by mouth every 8 (eight) hours as needed for moderate pain. maximum two daily   No facility-administered encounter medications on file as of 12/14/2015.     Activities of Daily Living In your present state of health, do you have any difficulty performing the following activities: 12/14/2015  Hearing? N  Vision? N  Difficulty concentrating or making decisions? N  Walking or climbing stairs? N  Dressing or bathing? N  Doing errands, shopping? N  Preparing Food and eating ? N  Using the Toilet? N  In the past six months, have you accidently leaked urine? Y  Do you have problems with loss of bowel control? N  Managing your Medications? N  Managing your Finances? N  Housekeeping or managing your Housekeeping? N  Some recent data might be hidden    Patient Care Team: Crecencio Mc, MD as PCP - General (Internal Medicine)    Assessment:    This is a routine wellness examination for [place]. The goal of the wellness visit is to assist the patient how to close the gaps in care and create a preventative care plan for the patient.  Taking calcium VIT D as appropriate/Osteoporosis risk reviewed.  Medications reviewed; taking without issues or barriers.  Safety issues reviewed; smoke detectors in the home. No firearms in the home. Wears seatbelts when driving or riding with others. No violence in the home.  No identified risk were noted; The patient was oriented x 3; appropriate in dress and manner and no objective failures at ADL's or IADL's.   Sleeping well, chronic pain is managed with medication.   Body mass index; discussed the importance of a healthy diet, water intake and exercise. She does have a healthy diet, adequate water intake and an exercise routine. Encouraged to incorporate chair exercises when needed. Educational material provided, exercises demonstrated.    High  dose influenza vaccine administered, L deltoid.  Tolerated well.    Health maintenance gaps; closed.  Patient Concerns: Raised mole, left side face, temple.  1 cm.  No itch, weeping, redness, pain.  Appeared 1 year ago and states shape has changed from circle to jagged.  Requests referral if appropriate.  Deferred to follow up with PCP.  Exercise Activities and Dietary recommendations Current Exercise Habits: Home exercise routine (TV exercise program), Type of exercise: walking, Time (Minutes): 30, Frequency (Times/Week): 5, Weekly Exercise (Minutes/Week): 150, Intensity: Mild  Goals    . Healthy Lifestyle          Stay hydrated and drink plenty of fluids. Stay active and continue exercise regimen. Low carb foods.  Lean meats, vegetables.      Fall Risk Fall Risk  12/14/2015 07/13/2014 09/01/2013  Falls in the past year? No No No   Depression Screen PHQ 2/9 Scores 12/14/2015 07/13/2014 09/01/2013  PHQ - 2 Score 0 0 0     Cognitive Testing MMSE - Mini Mental State Exam 12/14/2015  Orientation to time 5  Orientation to Place 5  Registration 3  Attention/ Calculation 5  Recall 3  Language- name 2 objects 2  Language- repeat 1  Language- follow 3 step command 3  Language- read & follow direction 1  Write a sentence 1  Copy design 1  Total score 30    Immunization History  Administered Date(s) Administered  . Influenza Split 02/11/2012  . Influenza, High Dose Seasonal PF 12/14/2015  . Pneumococcal Conjugate-13 09/01/2013  . Pneumococcal Polysaccharide-23 09/01/2005  . Tdap 07/18/2012  . Zoster 08/08/2003   Screening Tests Health Maintenance  Topic Date Due  . TETANUS/TDAP  07/19/2022  . INFLUENZA VACCINE  Completed  . DEXA SCAN  Completed  . ZOSTAVAX  Completed  . PNA vac Low Risk Adult  Completed      Plan:   End of life planning; Advance aging; Advanced directives discussed. Currently has no HCPOA/Living Will.  Educational material provided to help her continue  the conversation with family. Copy of HCPOA/Living Will short forms requested upon completion. Time spent discussing this topic is 20 minutes.   Medicare Attestation I have personally reviewed: The patient's medical and social history Their use of alcohol, tobacco or illicit drugs Their current medications and supplements The patient's functional ability including ADLs,fall risks, home safety risks, cognitive, and hearing and visual impairment Diet and physical activities Evidence for depression   The patient's weight, height, BMI, and visual acuity have been recorded in the chart.  I have made referrals and provided education to the patient based on review of the above and I have provided the patient with a written personalized care plan for preventive services.    During the course of  the visit the patient was educated and counseled about the following appropriate screening and preventive services:   Vaccines to include Pneumoccal, Influenza, Hepatitis B, Td, Zostavax, HCV  Electrocardiogram  Cardiovascular Disease  Colorectal cancer screening  Bone density screening  Diabetes screening  Glaucoma screening  Mammography/PAP  Nutrition counseling   Patient Instructions (the written plan) was given to the patient.   Varney Biles, LPN  [id number]

## 2015-12-15 NOTE — Addendum Note (Signed)
Addended by: Burnard Hawthorne on: 12/15/2015 11:36 AM   Modules accepted: Orders

## 2015-12-16 ENCOUNTER — Encounter: Payer: Self-pay | Admitting: Internal Medicine

## 2016-01-18 DIAGNOSIS — L821 Other seborrheic keratosis: Secondary | ICD-10-CM | POA: Diagnosis not present

## 2016-02-25 IMAGING — CT CT HEAD W/O CM
2 series · 16 of 30 positions shown, 20 images · non-contrast
Comparison: None.

CLINICAL DATA: Loss of consciousness. Syncope yesterday. Continued
dizziness.

EXAM:
CT HEAD WITHOUT CONTRAST
TECHNIQUE: Contiguous axial images were obtained from the base of the skull
through the vertex without intravenous contrast.

[Series 201: head w/o, idose (1) · axial · non-contrast · 0.46mm/px · z∈[+93,+218]mm · 13 of 31 slices shown, 17 images]
[im 3/31  brain]
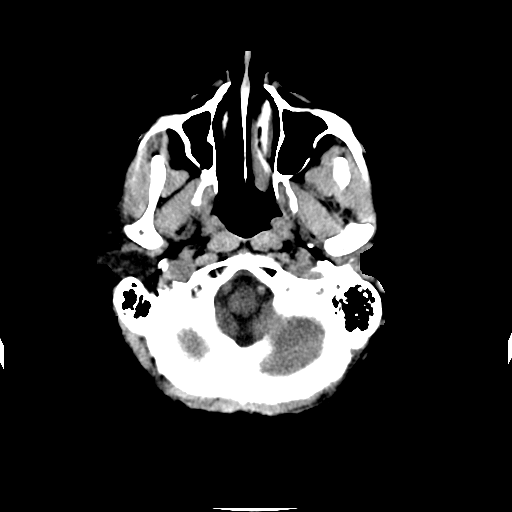
[im 3/31  bone]
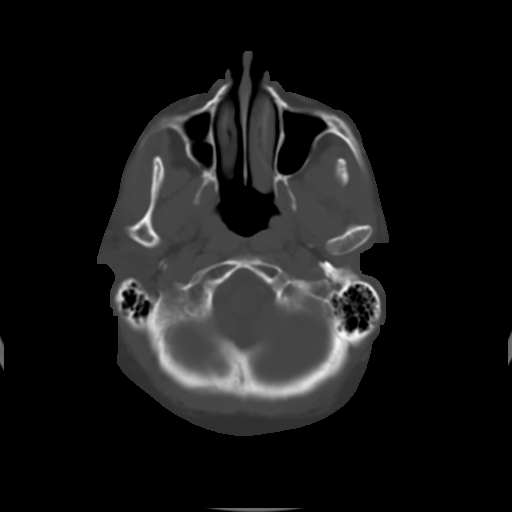
[im 5/31  brain]
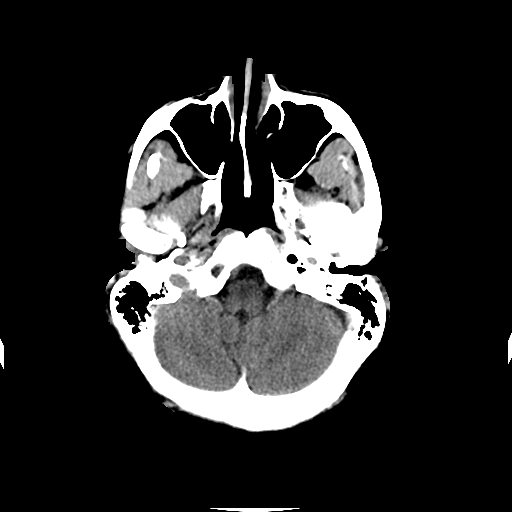
[im 7/31  brain]
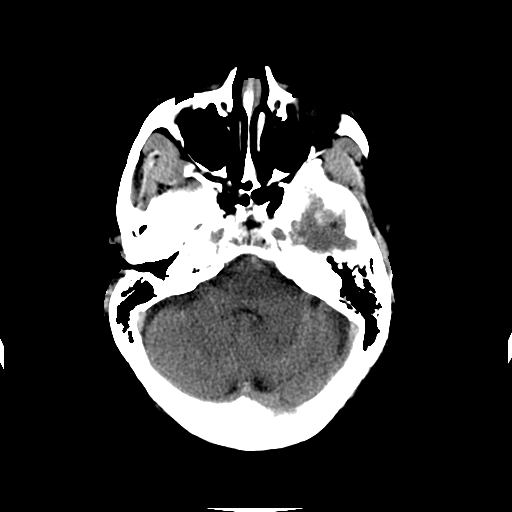
[im 9/31  brain]
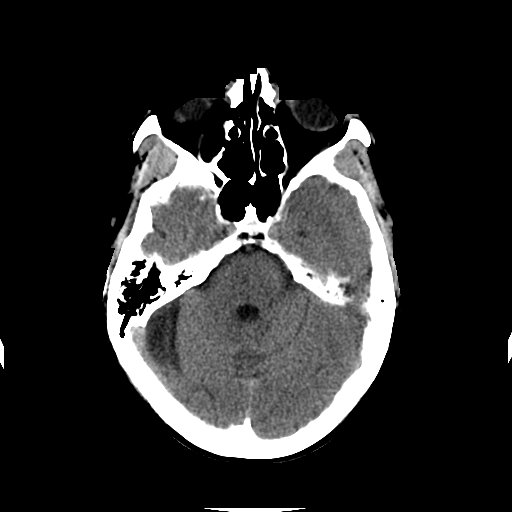
[im 11/31  brain]
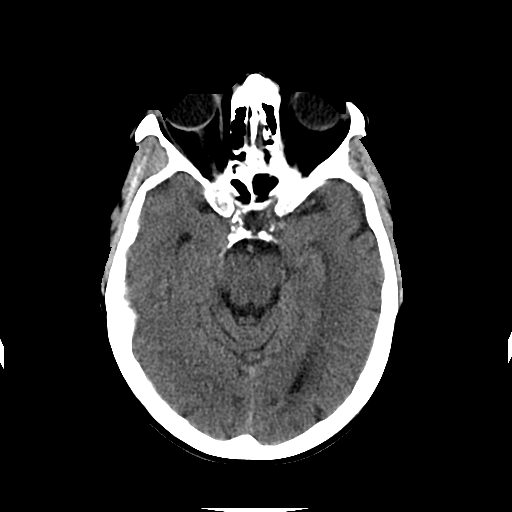
[im 11/31  bone]
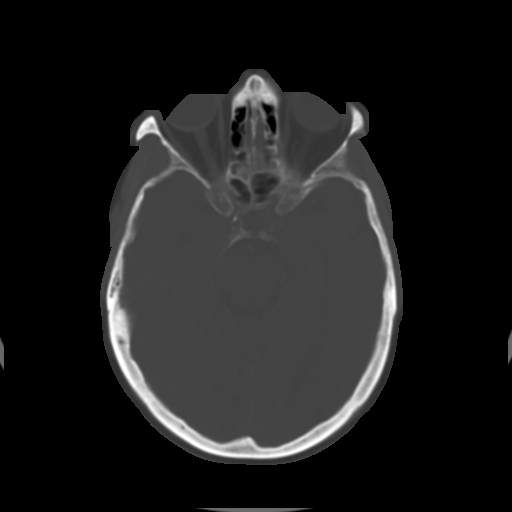
[im 13/31  brain]
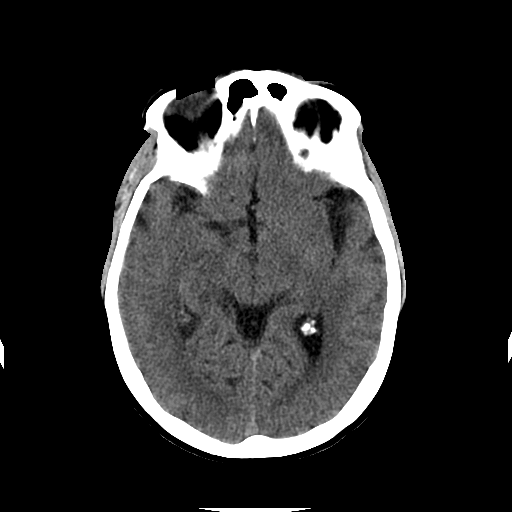
[im 16/31  brain]
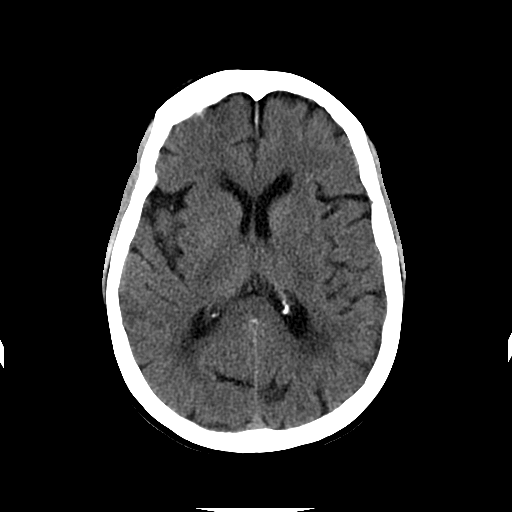
[im 18/31  brain]
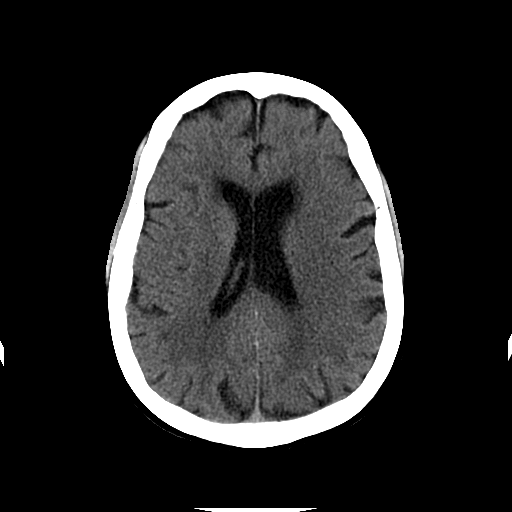
[im 20/31  brain]
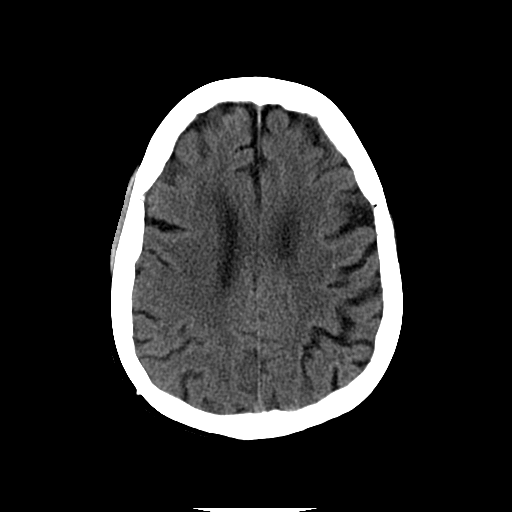
[im 20/31  bone]
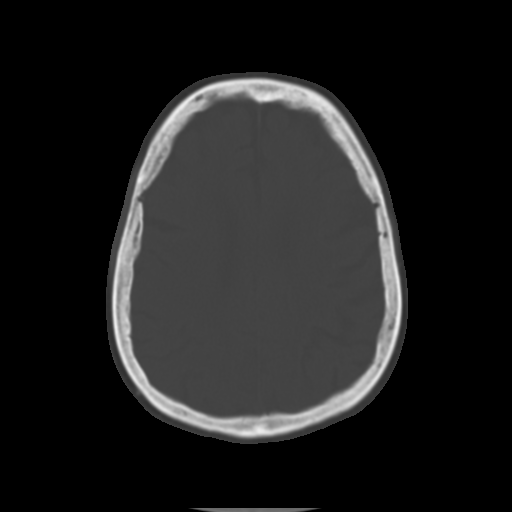
[im 22/31  brain]
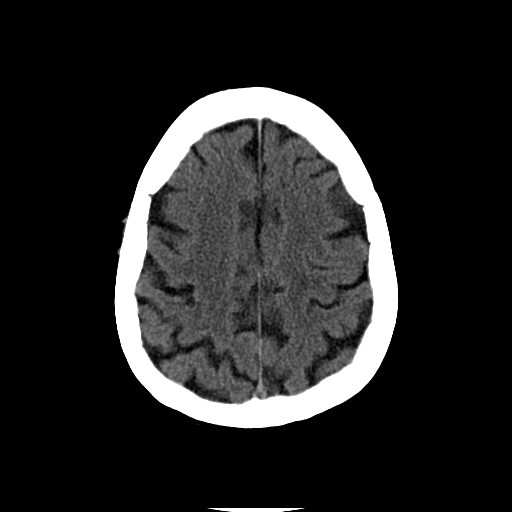
[im 24/31  brain]
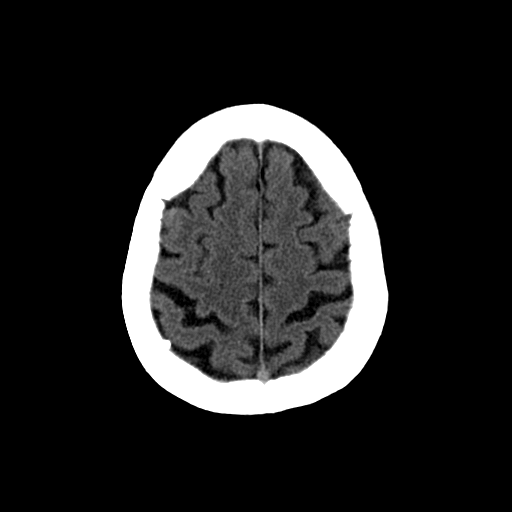
[im 26/31  brain]
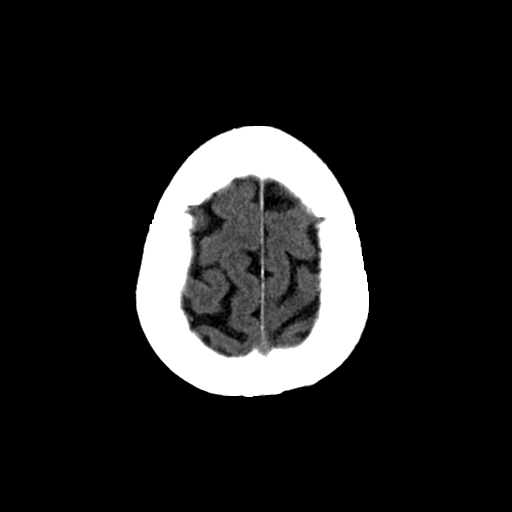
[im 28/31  brain]
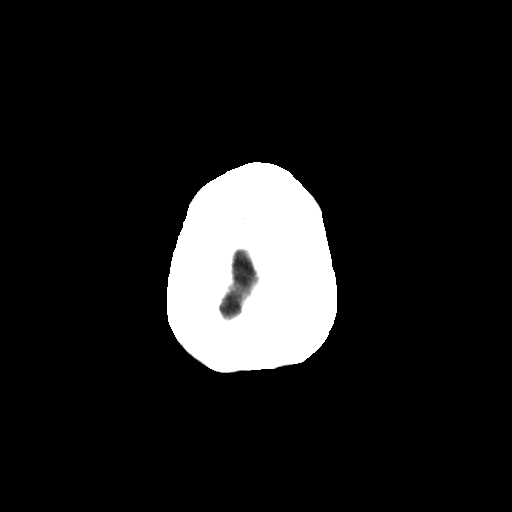
[im 28/31  bone]
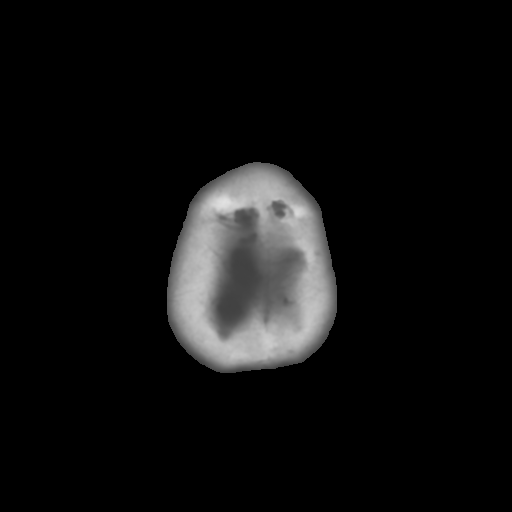

[Series 202: head w/o bone, idose (1) · axial · non-contrast · 0.46mm/px · z∈[+93,+133]mm · 3 of 31 slices shown]
[im 3/31  bone]
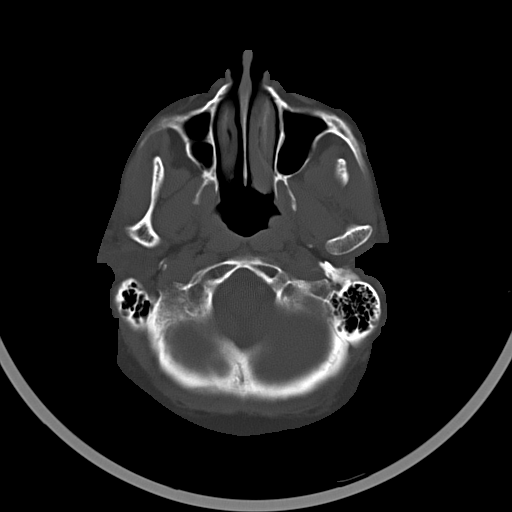
[im 7/31  bone]
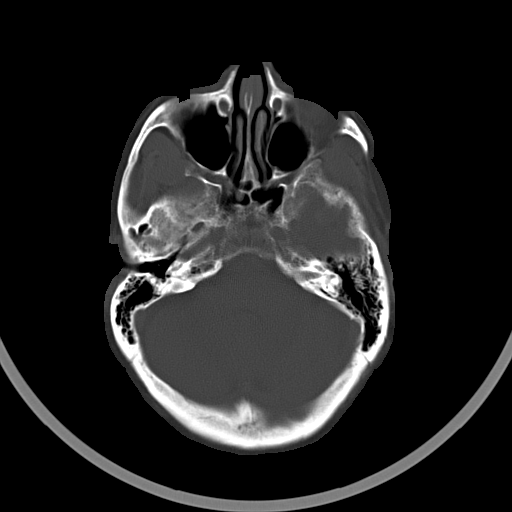
[im 11/31  bone]
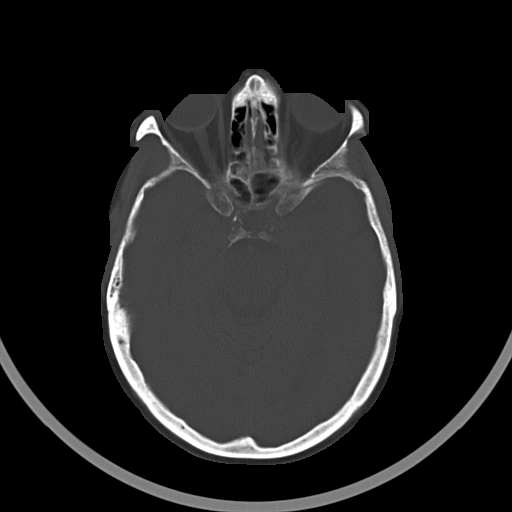

[16 of 30 positions shown; findings below may reference images not displayed]

FINDINGS: Patchy low-density throughout the deep white matter, most compatible
with chronic microvascular disease. Mild age related volume loss. No
acute intracranial abnormality. Specifically, no hemorrhage,
hydrocephalus, mass lesion, acute infarction, or significant
intracranial injury. No acute calvarial abnormality. Visualized
paranasal sinuses and mastoids clear. Orbital soft tissues
unremarkable.
IMPRESSION: No acute intracranial abnormality.

## 2016-02-25 IMAGING — CR DG CHEST 2V
2 series · 2 of 2 positions shown · non-contrast
Comparison: None.

CLINICAL DATA: Syncope yesterday, fall, headache

EXAM:
CHEST  2 VIEW

[chest pa]
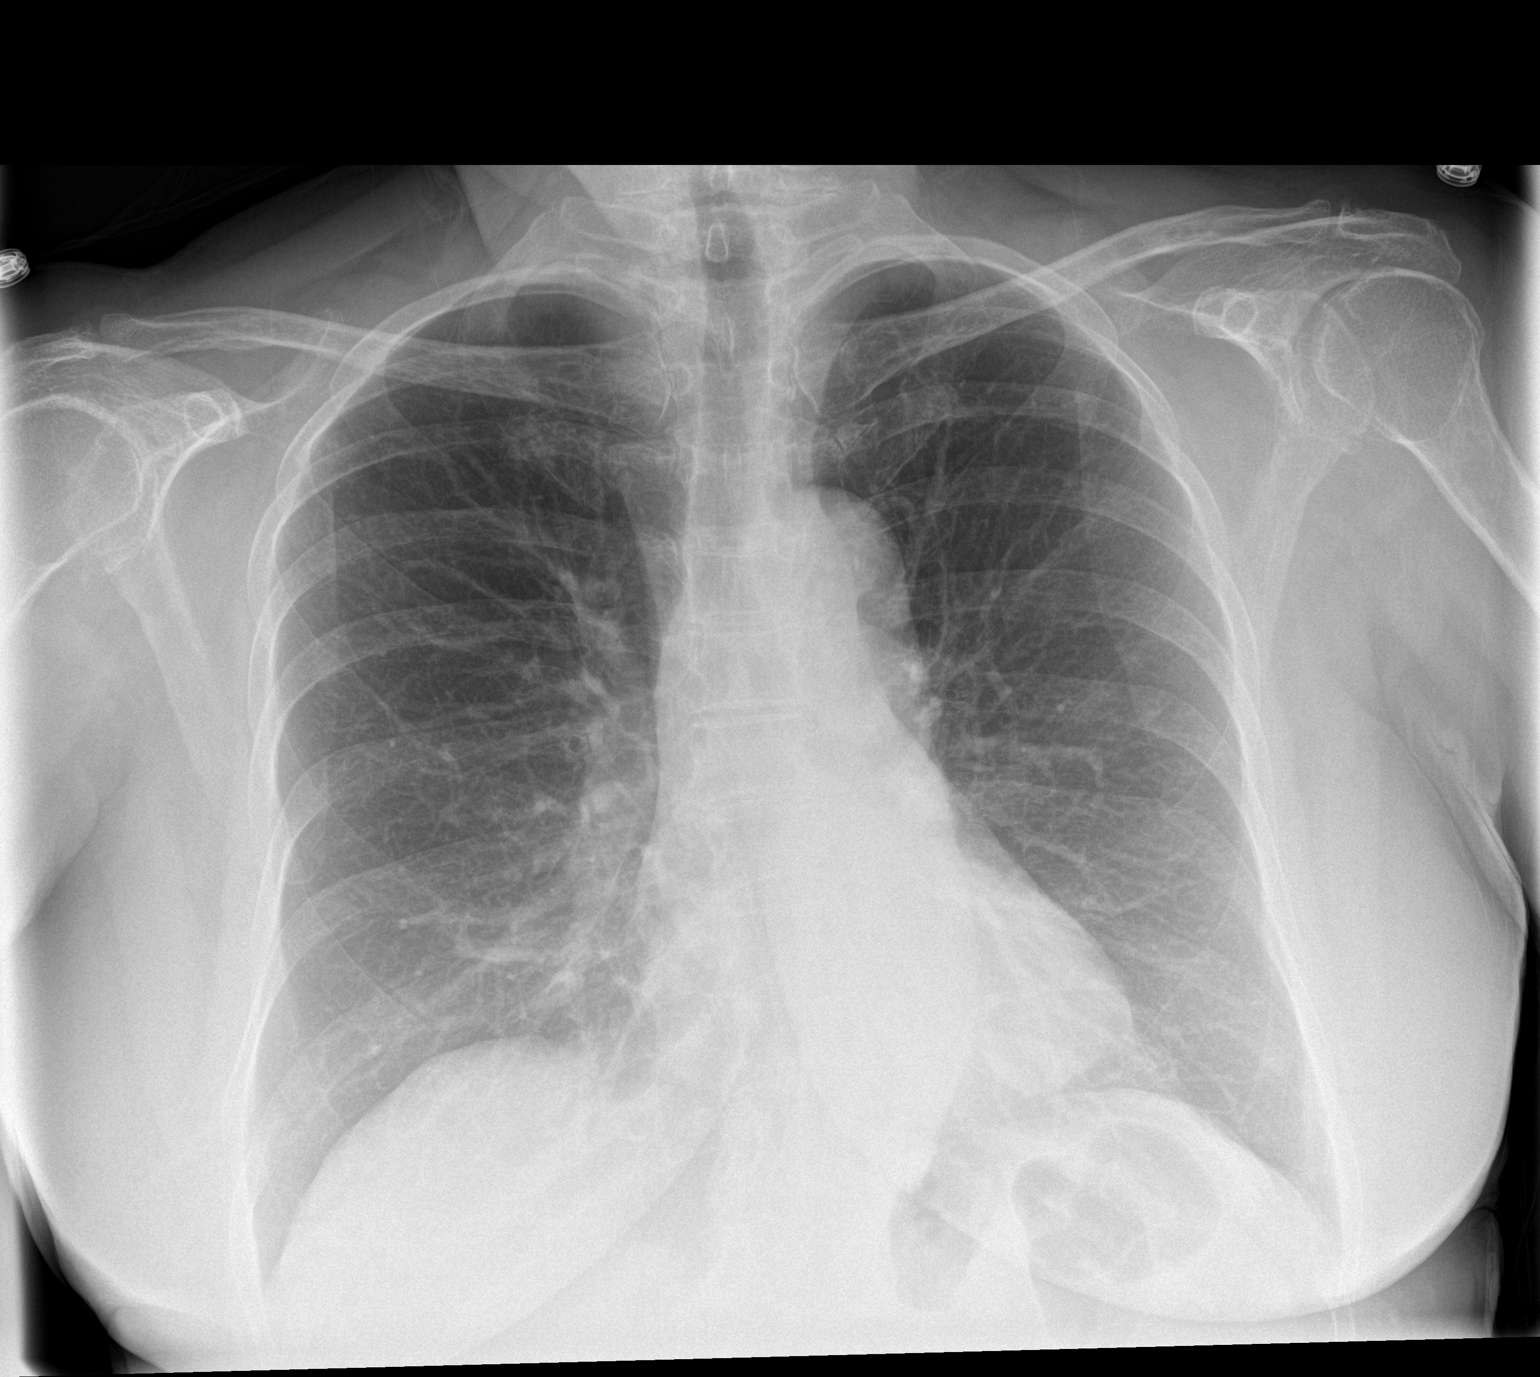

[chest lat]
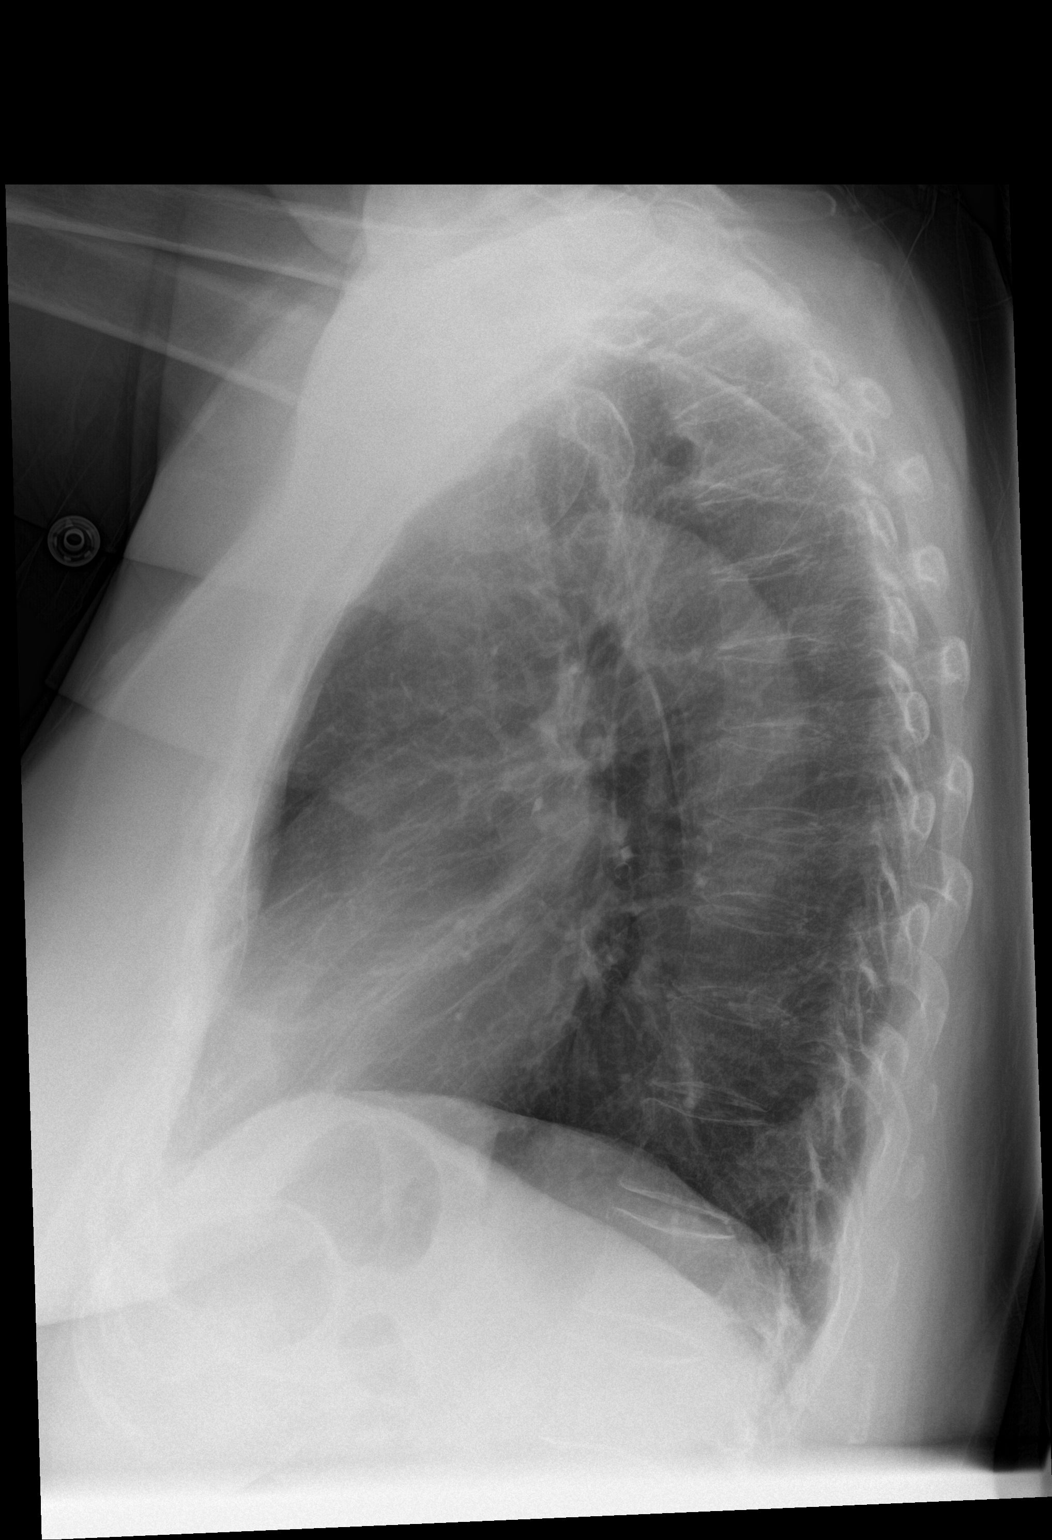

[2 of 2 positions shown; findings below may reference images not displayed]

FINDINGS: Cardiomediastinal silhouette is unremarkable. No acute infiltrate or
pleural effusion. No pulmonary edema. Thoracic spine osteopenia.
IMPRESSION: No active cardiopulmonary disease.

## 2016-04-26 ENCOUNTER — Telehealth: Payer: Self-pay | Admitting: Internal Medicine

## 2016-04-26 DIAGNOSIS — E7800 Pure hypercholesterolemia, unspecified: Secondary | ICD-10-CM

## 2016-04-26 DIAGNOSIS — R7301 Impaired fasting glucose: Secondary | ICD-10-CM

## 2016-04-26 DIAGNOSIS — M130 Polyarthritis, unspecified: Secondary | ICD-10-CM

## 2016-04-26 DIAGNOSIS — R5383 Other fatigue: Secondary | ICD-10-CM

## 2016-04-26 DIAGNOSIS — E559 Vitamin D deficiency, unspecified: Secondary | ICD-10-CM

## 2016-04-26 DIAGNOSIS — I10 Essential (primary) hypertension: Secondary | ICD-10-CM

## 2016-04-26 NOTE — Telephone Encounter (Signed)
Pt declined to get the AWV. Thank you! °

## 2016-04-26 NOTE — Telephone Encounter (Signed)
Pt would like to get labs done before appt on 06/20/2016. Need order please and thank you!

## 2016-04-29 NOTE — Telephone Encounter (Signed)
Fasting labs ordered

## 2016-06-20 ENCOUNTER — Ambulatory Visit (INDEPENDENT_AMBULATORY_CARE_PROVIDER_SITE_OTHER): Payer: PPO | Admitting: Internal Medicine

## 2016-06-20 ENCOUNTER — Ambulatory Visit (INDEPENDENT_AMBULATORY_CARE_PROVIDER_SITE_OTHER): Payer: PPO

## 2016-06-20 ENCOUNTER — Encounter: Payer: Self-pay | Admitting: Internal Medicine

## 2016-06-20 VITALS — BP 126/92 | HR 85 | Temp 98.2°F | Resp 16 | Ht 62.0 in | Wt 164.4 lb

## 2016-06-20 DIAGNOSIS — I10 Essential (primary) hypertension: Secondary | ICD-10-CM

## 2016-06-20 DIAGNOSIS — F09 Unspecified mental disorder due to known physiological condition: Secondary | ICD-10-CM | POA: Diagnosis not present

## 2016-06-20 DIAGNOSIS — R7303 Prediabetes: Secondary | ICD-10-CM | POA: Insufficient documentation

## 2016-06-20 DIAGNOSIS — M5412 Radiculopathy, cervical region: Secondary | ICD-10-CM

## 2016-06-20 DIAGNOSIS — R202 Paresthesia of skin: Secondary | ICD-10-CM | POA: Diagnosis not present

## 2016-06-20 DIAGNOSIS — R200 Anesthesia of skin: Secondary | ICD-10-CM | POA: Diagnosis not present

## 2016-06-20 DIAGNOSIS — Z79899 Other long term (current) drug therapy: Secondary | ICD-10-CM | POA: Diagnosis not present

## 2016-06-20 DIAGNOSIS — R7301 Impaired fasting glucose: Secondary | ICD-10-CM | POA: Diagnosis not present

## 2016-06-20 DIAGNOSIS — M50321 Other cervical disc degeneration at C4-C5 level: Secondary | ICD-10-CM | POA: Diagnosis not present

## 2016-06-20 DIAGNOSIS — E7800 Pure hypercholesterolemia, unspecified: Secondary | ICD-10-CM | POA: Diagnosis not present

## 2016-06-20 DIAGNOSIS — M50323 Other cervical disc degeneration at C6-C7 level: Secondary | ICD-10-CM | POA: Diagnosis not present

## 2016-06-20 DIAGNOSIS — I700 Atherosclerosis of aorta: Secondary | ICD-10-CM | POA: Diagnosis not present

## 2016-06-20 DIAGNOSIS — G629 Polyneuropathy, unspecified: Secondary | ICD-10-CM | POA: Diagnosis not present

## 2016-06-20 DIAGNOSIS — M50322 Other cervical disc degeneration at C5-C6 level: Secondary | ICD-10-CM | POA: Diagnosis not present

## 2016-06-20 LAB — COMPREHENSIVE METABOLIC PANEL
ALK PHOS: 56 U/L (ref 39–117)
ALT: 14 U/L (ref 0–35)
AST: 18 U/L (ref 0–37)
Albumin: 4.6 g/dL (ref 3.5–5.2)
BILIRUBIN TOTAL: 0.7 mg/dL (ref 0.2–1.2)
BUN: 11 mg/dL (ref 6–23)
CO2: 31 meq/L (ref 19–32)
CREATININE: 0.61 mg/dL (ref 0.40–1.20)
Calcium: 9.9 mg/dL (ref 8.4–10.5)
Chloride: 101 mEq/L (ref 96–112)
GFR: 100.82 mL/min (ref >60.00)
GLUCOSE: 110 mg/dL — AB (ref 70–99)
Potassium: 3.8 mEq/L (ref 3.5–5.1)
SODIUM: 139 meq/L (ref 135–145)
TOTAL PROTEIN: 7 g/dL (ref 6.0–8.3)

## 2016-06-20 LAB — LIPID PANEL
CHOL/HDL RATIO: 3
Cholesterol: 161 mg/dL (ref 0–200)
HDL: 55 mg/dL (ref >39.00)
LDL Cholesterol: 77 mg/dL (ref 0–99)
NONHDL: 105.57
Triglycerides: 141 mg/dL (ref 0.0–149.0)
VLDL: 28.2 mg/dL (ref 0.0–40.0)

## 2016-06-20 LAB — TSH: TSH: 1.89 u[IU]/mL (ref 0.35–4.50)

## 2016-06-20 LAB — HEMOGLOBIN A1C: HEMOGLOBIN A1C: 6.1 % (ref 4.6–6.5)

## 2016-06-20 LAB — VITAMIN B12: VITAMIN B 12: 373 pg/mL (ref 211–911)

## 2016-06-20 NOTE — Progress Notes (Signed)
Pre visit review using our clinic review tool, if applicable. No additional management support is needed unless otherwise documented below in the visit note. 

## 2016-06-20 NOTE — Progress Notes (Signed)
Subjective:  Patient ID: Lori Jimenez, female    DOB: [birth year]  Age: 76 y.o. MRN: [id number]  CC: The primary encounter diagnosis was Cervical radiculopathy at C5. Diagnoses of Numbness and tingling in both hands, Long-term use of high-risk medication, Pure hypercholesterolemia, Essential hypertension, Acquired cognitive dysfunction, Prediabetes, Impaired fasting glucose, Atherosclerosis of abdominal aorta ([place]), and Neuropathy ([place]) were also pertinent to this visit.  HPI Lori Jimenez presents for evaluation of new onset tingling in both arms, which she first noticed  4 months ago .  Wakes up with symptoms and they recur throughout the day. .  Aggravated by trying to open a jar.   DENIES WEAKNESS OF ARMS , but has dropped some smaller objects  And having trouble with putting on jewelry. . No history  Of neck pain or injury,  No recent   MVA<  No history of spinal  cord surgery.    Denies dizziness and vertigo.  But recalls that 2 years ago she fainted in the kitchen and was kept overnight at [facility] for observation due to blunt trauma to the left side of the head and right shoulder.  Feels that her mind has not been right since then  Has been more absent minded since then   Lab Results  Component Value Date   HGBA1C 6.1 06/20/2016    Says that she shares her father's history of some form of congenital spinal cord issue but can't describe it.  Elevated blood pressure,  Last advil dose was several days ago    Outpatient Medications Prior to Visit  Medication Sig Dispense Refill  . aspirin 81 MG tablet Take 81 mg by mouth daily.    . cyclobenzaprine (FLEXERIL) 10 MG tablet Take 1 tablet (10 mg total) by mouth 3 (three) times daily as needed for muscle spasms. 90 tablet 1  . losartan-hydrochlorothiazide (HYZAAR) 100-25 MG tablet Take 1 tablet by mouth daily.  3  . Probiotic Product (PROBIOTIC DAILY PO) Take 1 tablet by mouth daily.    . simvastatin (ZOCOR) 20 MG tablet TAKE 1 TABLET(20 MG)  BY MOUTH EVERY EVENING 90 tablet 3  . traMADol (ULTRAM) 50 MG tablet Take 1 tablet (50 mg total) by mouth every 8 (eight) hours as needed for moderate pain. maximum two daily 120 tablet 4   No facility-administered medications prior to visit.     Review of Systems;  Patient denies headache, fevers, malaise, unintentional weight loss, skin rash, eye pain, sinus congestion and sinus pain, sore throat, dysphagia,  hemoptysis , cough, dyspnea, wheezing, chest pain, palpitations, orthopnea, edema, abdominal pain, nausea, melena, diarrhea, constipation, flank pain, dysuria, hematuria, urinary  Frequency, nocturia,  seizures,  Focal weakness, Loss of consciousness,  Tremor, insomnia, depression, anxiety, and suicidal ideation.      Objective:  BP (!) 126/92   Pulse 85   Temp 98.2 F (36.8 C) (Oral)   Resp 16   Ht 5\' 2"  (1.575 m)   Wt 164 lb 6.4 oz (74.6 kg)   SpO2 94%   BMI 30.07 kg/m   BP Readings from Last 3 Encounters:  06/20/16 (!) 126/92  12/14/15 130/70  10/11/15 122/84    Wt Readings from Last 3 Encounters:  06/20/16 164 lb 6.4 oz (74.6 kg)  12/14/15 163 lb 12.8 oz (74.3 kg)  10/11/15 162 lb (73.5 kg)    General appearance: alert, cooperative and appears stated age Ears: normal TM's and external ear canals both ears Throat: lips, mucosa, and tongue normal;  teeth and gums normal Neck: no adenopathy, no carotid bruit, supple, symmetrical, trachea midline and thyroid not enlarged, symmetric, no tenderness/mass/nodules Back: symmetric, no curvature. ROM normal. No CVA tenderness. Lungs: clear to auscultation bilaterally Heart: regular rate and rhythm, S1, S2 normal, no murmur, click, rub or gallop Abdomen: soft, non-tender; bowel sounds normal; no masses,  no organomegaly Pulses: 2+ and symmetric Skin: Skin color, texture, turgor normal. No rashes or lesions Lymph nodes: Cervical, supraclavicular, and axillary nodes normal.  Lab Results  Component Value Date   HGBA1C 6.1  06/20/2016   HGBA1C 5.9 12/14/2015   HGBA1C 6.1 07/06/2015    Lab Results  Component Value Date   CREATININE 0.61 06/20/2016   CREATININE 0.68 12/14/2015   CREATININE 0.59 07/12/2015    Lab Results  Component Value Date   WBC 9.3 07/21/2014   HGB 14.5 07/21/2014   HCT 41.8 06/20/2016   PLT 277 07/21/2014   GLUCOSE 110 (H) 06/20/2016   CHOL 161 06/20/2016   TRIG 141.0 06/20/2016   HDL 55.00 06/20/2016   LDLDIRECT 83.0 12/14/2015   LDLCALC 77 06/20/2016   ALT 14 06/20/2016   AST 18 06/20/2016   NA 139 06/20/2016   K 3.8 06/20/2016   CL 101 06/20/2016   CREATININE 0.61 06/20/2016   BUN 11 06/20/2016   CO2 31 06/20/2016   TSH 1.89 06/20/2016   INR 1.09 07/21/2014   HGBA1C 6.1 06/20/2016   MICROALBUR 1.9 07/06/2015    Assessment & Plan:   Problem List Items Addressed This Visit    Acquired cognitive dysfunction    She continues to report cognitive deficits y since her  Syncopal event , suggesting that she suffered a concussion when she hit her head  .  She has no balance issues or incontinence to suggest NPH.  MRI was ordered last year, but pateint deferred.  Neurology consultation advised.       Atherosclerosis of abdominal aorta (HCC)    Her lipids are Well controlled on current statin therapy.   Liver enzymes are normal , no changes today.  Lab Results  Component Value Date   CHOL 161 06/20/2016   HDL 55.00 06/20/2016   LDLCALC 77 06/20/2016   LDLDIRECT 83.0 12/14/2015   TRIG 141.0 06/20/2016   CHOLHDL 3 06/20/2016   Lab Results  Component Value Date   ALT 14 06/20/2016   AST 18 06/20/2016   ALKPHOS 56 06/20/2016   BILITOT 0.7 06/20/2016           Essential hypertension    Elevated today,  But states that home readings are lower,  Needs to return for RN visit  With machine FOR CALIBRATION       Hyperlipidemia   Relevant Orders   Lipid panel (Completed)   Impaired fasting glucose    She is prediabetic. I have addressed  BMI and recommended a  low glycemic index diet utilizing smaller more frequent meals to increase metabolism.  I have also recommended that patient start exercising with a goal of 30 minutes of aerobic exercise a minimum of 5 days per week. Screening for lipid disorders,and diabetes to be done today.  Lab Results  Component Value Date   HGBA1C 6.1 06/20/2016         Neuropathy ([place])    She has no signs of reversible deficiencies .  Plain films of cervical spine done today  Show minimal anterolisthesis of each vertebrae from c4 level distally with minimal spurring .  Will refer to neurology  for further evaluation   Lab Results  Component Value Date   TSH 1.89 06/20/2016   Lab Results  Component Value Date   HGBA1C 6.1 06/20/2016   Lab Results  Component Value Date   VITAMINB12 373 06/20/2016   Lab Results  Component Value Date   FOLATE 15.4 06/20/2016        RESOLVED: Prediabetes    Other Visit Diagnoses    Cervical radiculopathy at C5    -  Primary   Relevant Orders   DG Cervical Spine Complete (Completed)   Numbness and tingling in both hands       Relevant Orders   B12 (Completed)   Methylmalonic Acid   Folate, RBC and Serum (Completed)   TSH (Completed)   RPR   Hemoglobin A1c (Completed)   Long-term use of high-risk medication       Relevant Orders   Comprehensive metabolic panel (Completed)      I am having Ms. Vasko maintain her aspirin, Probiotic Product (PROBIOTIC DAILY PO), cyclobenzaprine, losartan-hydrochlorothiazide, traMADol, and simvastatin.  No orders of the defined types were placed in this encounter.   There are no discontinued medications.  Follow-up: Return in about 3 months (around 09/19/2016) for follow up diabetes.   Crecencio Mc, MD

## 2016-06-20 NOTE — Patient Instructions (Addendum)
IF YOUR x rays and labs today do not reveal a potential cause for your symptoms,  I will refer you to Neurology for further testing,  Or if you prefer,  I will continue the workup with an MRI of your brain    Please return with your home blood pressure machine for an RN visit  to calibrate if t

## 2016-06-20 NOTE — Assessment & Plan Note (Signed)
Elevated today,  But states that home readings are lower,  Needs to return for RN visit  With machine FOR CALIBRATION

## 2016-06-20 NOTE — Assessment & Plan Note (Addendum)
She continues to report cognitive deficits y since her  Syncopal event , suggesting that she suffered a concussion when she hit her head  .  She has no balance issues or incontinence to suggest NPH.  MRI was ordered last year, but pateint deferred.  Neurology consultation advised.

## 2016-06-21 ENCOUNTER — Encounter: Payer: Self-pay | Admitting: Internal Medicine

## 2016-06-21 DIAGNOSIS — G629 Polyneuropathy, unspecified: Secondary | ICD-10-CM | POA: Insufficient documentation

## 2016-06-21 LAB — FOLATE, RBC AND SERUM
FOLATE, HEMOLYSATE: 417.9 ng/mL
FOLATE: 15.4 ng/mL (ref >3.0)
Folate, RBC: 1000 ng/mL (ref >498)
Hematocrit: 41.8 % (ref 34.0–46.6)

## 2016-06-21 LAB — RPR

## 2016-06-21 NOTE — Assessment & Plan Note (Signed)
Her lipids are Well controlled on current statin therapy.   Liver enzymes are normal , no changes today.  Lab Results  Component Value Date   CHOL 161 06/20/2016   HDL 55.00 06/20/2016   LDLCALC 77 06/20/2016   LDLDIRECT 83.0 12/14/2015   TRIG 141.0 06/20/2016   CHOLHDL 3 06/20/2016   Lab Results  Component Value Date   ALT 14 06/20/2016   AST 18 06/20/2016   ALKPHOS 56 06/20/2016   BILITOT 0.7 06/20/2016

## 2016-06-21 NOTE — Assessment & Plan Note (Signed)
She is prediabetic. I have addressed  BMI and recommended a low glycemic index diet utilizing smaller more frequent meals to increase metabolism.  I have also recommended that patient start exercising with a goal of 30 minutes of aerobic exercise a minimum of 5 days per week. Screening for lipid disorders,and diabetes to be done today.  Lab Results  Component Value Date   HGBA1C 6.1 06/20/2016

## 2016-06-21 NOTE — Assessment & Plan Note (Signed)
She has no signs of reversible deficiencies .  Plain films of cervical spine done today  Show minimal anterolisthesis of each vertebrae from c4 level distally with minimal spurring .  Will refer to neurology for further evaluation   Lab Results  Component Value Date   TSH 1.89 06/20/2016   Lab Results  Component Value Date   HGBA1C 6.1 06/20/2016   Lab Results  Component Value Date   VITAMINB12 373 06/20/2016   Lab Results  Component Value Date   FOLATE 15.4 06/20/2016

## 2016-06-23 LAB — METHYLMALONIC ACID, SERUM: Methylmalonic Acid, Quant: 124 nmol/L (ref 87–318)

## 2016-06-25 ENCOUNTER — Encounter: Payer: Self-pay | Admitting: Internal Medicine

## 2016-07-19 ENCOUNTER — Encounter: Payer: Self-pay | Admitting: Internal Medicine

## 2016-07-20 ENCOUNTER — Other Ambulatory Visit: Payer: Self-pay | Admitting: Internal Medicine

## 2016-07-20 DIAGNOSIS — F09 Unspecified mental disorder due to known physiological condition: Secondary | ICD-10-CM

## 2016-07-20 DIAGNOSIS — G4452 New daily persistent headache (NDPH): Secondary | ICD-10-CM

## 2016-07-20 NOTE — Progress Notes (Unsigned)
RI

## 2016-07-27 ENCOUNTER — Other Ambulatory Visit: Payer: Self-pay | Admitting: Internal Medicine

## 2016-07-27 ENCOUNTER — Telehealth: Payer: Self-pay | Admitting: Internal Medicine

## 2016-07-27 NOTE — Telephone Encounter (Signed)
Pt called and stated that she needs to make a 1 week follow up appt with Dr. Derrel Nip after her MRI schedule for 5/22. Please advise, as to where to schedule.   Call pt @ [phone] (may leave msg)

## 2016-07-28 NOTE — Telephone Encounter (Signed)
Patient has been scheduled

## 2016-08-07 ENCOUNTER — Other Ambulatory Visit: Payer: Self-pay | Admitting: Internal Medicine

## 2016-08-08 ENCOUNTER — Ambulatory Visit
Admission: RE | Admit: 2016-08-08 | Discharge: 2016-08-08 | Disposition: A | Discharge status: Home / Self Care | Payer: PPO | Source: Ambulatory Visit | Attending: Internal Medicine | Admitting: Internal Medicine

## 2016-08-08 DIAGNOSIS — F09 Unspecified mental disorder due to known physiological condition: Secondary | ICD-10-CM | POA: Diagnosis not present

## 2016-08-08 DIAGNOSIS — G4452 New daily persistent headache (NDPH): Secondary | ICD-10-CM

## 2016-08-08 DIAGNOSIS — I6782 Cerebral ischemia: Secondary | ICD-10-CM | POA: Diagnosis not present

## 2016-08-08 DIAGNOSIS — R51 Headache: Secondary | ICD-10-CM | POA: Diagnosis not present

## 2016-08-08 DIAGNOSIS — G939 Disorder of brain, unspecified: Secondary | ICD-10-CM | POA: Diagnosis not present

## 2016-08-08 MED ORDER — GADOBENATE DIMEGLUMINE 529 MG/ML IV SOLN
15.0000 mL | Freq: Once | INTRAVENOUS | Status: AC | PRN
  Administered 2016-08-08: 15 mL via INTRAVENOUS

## 2016-08-10 ENCOUNTER — Encounter: Payer: Self-pay | Admitting: Internal Medicine

## 2016-08-16 ENCOUNTER — Encounter: Payer: Self-pay | Admitting: Internal Medicine

## 2016-08-16 ENCOUNTER — Ambulatory Visit (INDEPENDENT_AMBULATORY_CARE_PROVIDER_SITE_OTHER): Payer: PPO | Admitting: Internal Medicine

## 2016-08-16 DIAGNOSIS — G629 Polyneuropathy, unspecified: Secondary | ICD-10-CM | POA: Diagnosis not present

## 2016-08-16 DIAGNOSIS — F09 Unspecified mental disorder due to known physiological condition: Secondary | ICD-10-CM

## 2016-08-16 NOTE — Patient Instructions (Signed)
You scored 29/30 on your Mini Mental Status Exam (MMSE)  You had some trouble with "naming" animals and words,  So there is some decline in recall .  The referral to [facility] to assess your memory and your neuropathy is a good idea

## 2016-08-16 NOTE — Progress Notes (Signed)
Subjective:  Patient ID: Lori Jimenez, female    DOB: 1940/06/22  Age: 76 y.o. MRN: [id number]  CC: Diagnoses of Neuropathy and Acquired cognitive dysfunction were pertinent to this visit. 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   MRI of brain with and without contrast was done done recently showing age related changes (atrophy) and microvascular changes.  She is accompanied by her daughter Lori Jimenez  who provides additional insight.  Mother lives with Lori Jimenez and Dana's husband.  Lori Jimenez has been concerned about mother's forgetfulness.   Her forgetfulness has been  regarding details of preparing certain meals,  Misplacing recipe cards, and apparently forgetting to mention recent trips to the store, etc However, there appears to be some disagreement and tension between mother and daughter about specific events . For instance, daughter states that because her mother was exhibiting a disinclination to drive, daughter has been trying  to enforce a "no driving" situation,  but mother has at times not complied. There have been no traffic violations,  No fender benders, no getting lost on way from or to home.  No cooking accidents. Patient states that she feels "less sharp" when she is trying to multi task.    2) also continuing to report that her  hands feel tingly all the time .having trouble with buttons,  Tasks requiring fine motor skills.  Not dropping anything..    Sleeps on her side , alternates  Sides.  Has episodes of neck pain but not constant,  Had an episode recently while washing hair in the shower where her scalp was exquisitely painful to touch  Lasted only a few seconds/minutes.   Home BP has been typically 120 to 128/70 to 24   Discussed [facility] referral for both  issues  Daughter Lori Jimenez needs a Tues Wed or Thursday  To drive her to appt  (works in [place] )    Lab Results  Component Value Date   HGBA1C 6.1 06/20/2016   Lab Results  Component Value Date   CHOL 161 06/20/2016   HDL 55.00 06/20/2016   LDLCALC 77 06/20/2016   LDLDIRECT 83.0 12/14/2015   TRIG 141.0 06/20/2016   CHOLHDL 3 06/20/2016    Outpatient Medications Prior to Visit  Medication Sig Dispense Refill  . aspirin 81 MG tablet Take 81 mg by mouth daily.    . cyclobenzaprine (FLEXERIL) 10 MG tablet Take 1 tablet (10 mg total) by mouth 3 (three) times daily as needed for muscle spasms. 90 tablet 1  . losartan-hydrochlorothiazide (HYZAAR) 100-25 MG tablet Take 1 tablet by mouth daily.  3  . Probiotic Product (PROBIOTIC DAILY PO) Take 1 tablet by mouth daily.    . simvastatin (ZOCOR) 20 MG tablet TAKE 1 TABLET(20 MG) BY MOUTH EVERY EVENING 90 tablet 0  . traMADol (ULTRAM) 50 MG tablet Take 1 tablet (50 mg total) by mouth every 8 (eight) hours as needed for moderate pain. maximum two daily 120 tablet 4  . losartan-hydrochlorothiazide (HYZAAR) 100-25 MG tablet TAKE 1 TABLET BY MOUTH DAILY (Patient not taking: Reported on 08/16/2016) 90 tablet 0   No facility-administered medications prior to visit.     Review of Systems;  Patient denies headache, fevers, malaise, unintentional weight loss, skin rash, eye pain, sinus congestion and sinus pain, sore  throat, dysphagia,  hemoptysis , cough, dyspnea, wheezing, chest pain, palpitations, orthopnea, edema, abdominal pain, nausea, melena, diarrhea, constipation, flank pain, dysuria, hematuria, urinary  Frequency, nocturia, numbness, tingling, seizures,  Focal weakness, Loss of consciousness,  Tremor, insomnia, depression, anxiety, and suicidal ideation.      Objective:  BP 128/76 (BP Location: Left Arm, Patient Position: Sitting, Cuff Size: Normal)   Pulse 88   Temp 97.7 F (36.5 C) (Oral)   Resp 16   Ht 5\' 2"  (1.575 m)   Wt 165 lb  12.8 oz (75.2 kg)   SpO2 94%   BMI 30.33 kg/m   BP Readings from Last 3 Encounters:  08/16/16 128/76  06/20/16 (!) 126/92  12/14/15 130/70    Wt Readings from Last 3 Encounters:  08/16/16 165 lb 12.8 oz (75.2 kg)  06/20/16 164 lb 6.4 oz (74.6 kg)  12/14/15 163 lb 12.8 oz (74.3 kg)    General appearance: alert, cooperative and appears stated age Ears: normal TM's and external ear canals both ears Throat: lips, mucosa, and tongue normal; teeth and gums normal Neck: no adenopathy, no carotid bruit, supple, symmetrical, trachea midline and thyroid not enlarged, symmetric, no tenderness/mass/nodules Back: symmetric, no curvature. ROM normal. No CVA tenderness. Lungs: clear to auscultation bilaterally Heart: regular rate and rhythm, S1, S2 normal, no murmur, click, rub or gallop Abdomen: soft, non-tender; bowel sounds normal; no masses,  no organomegaly Pulses: 2+ and symmetric Skin: Skin color, texture, turgor normal. No rashes or lesions Lymph nodes: Cervical, supraclavicular, and axillary nodes normal. MMSE:  29/30 Clock drawing normal   Naming:  Animals :15  Words beginning with "f":  5    Lab Results  Component Value Date   HGBA1C 6.1 06/20/2016   HGBA1C 5.9 12/14/2015   HGBA1C 6.1 07/06/2015    Lab Results  Component Value Date   CREATININE 0.61 06/20/2016   CREATININE 0.68 12/14/2015   CREATININE 0.59 07/12/2015    Lab Results  Component Value Date   WBC 9.3 07/21/2014   HGB 14.5 07/21/2014   HCT 41.8 06/20/2016   PLT 277 07/21/2014   GLUCOSE 110 (H) 06/20/2016   CHOL 161 06/20/2016   TRIG 141.0 06/20/2016   HDL 55.00 06/20/2016   LDLDIRECT 83.0 12/14/2015   LDLCALC 77 06/20/2016   ALT 14 06/20/2016   AST 18 06/20/2016   NA 139 06/20/2016   K 3.8 06/20/2016   CL 101 06/20/2016   CREATININE 0.61 06/20/2016   BUN 11 06/20/2016   CO2 31 06/20/2016   TSH 1.89 06/20/2016   INR 1.09 07/21/2014   HGBA1C 6.1 06/20/2016   MICROALBUR 1.9 07/06/2015     Mr Brain W JA Contrast  Result Date: 08/08/2016 CLINICAL DATA:  76 y/o F; new daily persistent headache posteriorly. Acquired cognitive dysfunction. Bilateral hand numbness. Short-term memory issues. EXAM: MRI HEAD WITHOUT AND WITH CONTRAST TECHNIQUE: Multiplanar, multiecho pulse sequences of the brain and surrounding structures were obtained without and with intravenous contrast. CONTRAST:  62mL MULTIHANCE GADOBENATE DIMEGLUMINE 529 MG/ML IV SOLN COMPARISON:  None. FINDINGS: Brain: No acute infarction, hemorrhage, hydrocephalus, extra-axial collection or mass lesion. Scattered foci of T2 FLAIR hyperintense signal abnormality in subcortical and periventricular white matter as well as the pons compatible with moderate chronic microvascular ischemic changes. Mild brain parenchymal volume loss. After administration of intravenous contrast there is no abnormal enhancement of the brain. Vascular: Normal flow voids. Skull and upper cervical spine: Normal marrow signal. Sinuses/Orbits: Negative. Other: None. IMPRESSION: 1. No acute intracranial abnormality  or abnormal enhancement of the brain. 2. Moderate chronic microvascular ischemic changes and mild parenchymal volume loss of the brain. Electronically Signed   By: Kristine Garbe M.D.   On: 08/08/2016 17:24    Assessment & Plan:   Problem List Items Addressed This Visit    Neuropathy    Unclear if this is peripheral vs central, from cervical spine radiculopathy Referral to [facility] to evaluate  With EMG/[place] studies        Acquired cognitive dysfunction    No evidence of dementia by MMSE, but her ability to name was considerably low. Per patient the changes became more apparent after her concussion in 2016.  She is concerned about progression.  Referral to [facility]         A total of 60 minutes was spent with patient and daughter, more than half of which was spent in counseling patient on the above mentioned issues ,  reviewing and explaining recent labs and imaging studies done, and coordination of care. I am having Ms. Dattilo maintain her aspirin, Probiotic Product (PROBIOTIC DAILY PO), cyclobenzaprine, losartan-hydrochlorothiazide, traMADol, and simvastatin.  No orders of the defined types were placed in this encounter.   Medications Discontinued During This Encounter  Medication Reason  . losartan-hydrochlorothiazide (HYZAAR) 646-80 MG tablet Duplicate    Follow-up: No Follow-up on file.   Crecencio Mc, MD

## 2016-08-19 NOTE — Assessment & Plan Note (Signed)
Unclear if this is peripheral vs central, from cervical spine radiculopathy Referral to [facility] to evaluate  With EMG/[place] studies

## 2016-08-19 NOTE — Assessment & Plan Note (Addendum)
No evidence of dementia by MMSE, but her ability to name was considerably low. Per patient the changes became more apparent after her concussion in 2016.  She is concerned about progression.  Referral to [facility]

## 2016-10-19 ENCOUNTER — Telehealth: Payer: Self-pay | Admitting: Internal Medicine

## 2016-10-19 NOTE — Telephone Encounter (Signed)
Pt called back returning your call. Please advise, thank you!  Call pt @ [phone]

## 2016-10-19 NOTE — Telephone Encounter (Signed)
LMTCB

## 2016-10-19 NOTE — Telephone Encounter (Signed)
Spoke with pt and she stated that her medication bottles state that she would need to contact her PCP before anymore refills. So pt thought that it was because she needed to have her labs done again. I noticed that she just had A1C, lipid, cmp, b12, and several other things drawn on 07/17/2016. Pt does not have a diabetes diagnoses but does have impaired fasting glucose. Pt is wanting to know if she needs to have her labs drawn again when she comes in to see Denisa on 11/17/2016. Pt also is scheduled to see you on 12/19/2016 and does not remember scheduling the appt and stated that she does not usually follow up every 6 months, she just comes in when she needs too, so the other question she had was does she need to keep the appt to see you in October?

## 2016-10-19 NOTE — Telephone Encounter (Signed)
Pt called and wanted to know if she can get lab work done when shwe comes in to see Denisa on 8/31. Please advise, thank you!  Call pt @ [phone]

## 2016-10-19 NOTE — Telephone Encounter (Signed)
She needs labs every 6 months because of the cholesterol and blood pressure medications..  She needs OV every 3 months for refills on tramadol.  She needs to see me every 6 months for follow up on hypertension, hyperlipidemia and prediabets

## 2016-10-20 MED ORDER — SIMVASTATIN 20 MG PO TABS: ORAL_TABLET | ORAL | 0 refills | Status: DC

## 2016-10-20 MED ORDER — LOSARTAN POTASSIUM-HCTZ 100-25 MG PO TABS: 1.0000 | ORAL_TABLET | Freq: Every day | ORAL | 0 refills | Status: DC

## 2016-10-20 NOTE — Telephone Encounter (Signed)
Spoke with pt and informed her of Dr. Lupita Dawn message below. The pt gave a verbal understanding and was okay with keeping her appt on 12/19/2016. Also explained to the pt that we would refill her medications but that if she does not come in for her appt on 10/2/108 she would not be able to get anymore refills. Pt gave a verbal understanding.

## 2016-10-20 NOTE — Telephone Encounter (Signed)
LMTCB

## 2016-10-29 ENCOUNTER — Other Ambulatory Visit: Payer: Self-pay | Admitting: Internal Medicine

## 2016-11-06 ENCOUNTER — Other Ambulatory Visit: Payer: Self-pay | Admitting: Internal Medicine

## 2016-11-17 ENCOUNTER — Ambulatory Visit: Payer: PPO

## 2016-12-19 ENCOUNTER — Encounter: Payer: Self-pay | Admitting: Internal Medicine

## 2016-12-19 ENCOUNTER — Ambulatory Visit: Payer: PPO

## 2016-12-19 ENCOUNTER — Ambulatory Visit (INDEPENDENT_AMBULATORY_CARE_PROVIDER_SITE_OTHER): Payer: PPO | Admitting: Internal Medicine

## 2016-12-19 VITALS — BP 126/82 | HR 81 | Temp 97.9°F | Resp 15 | Ht 62.0 in | Wt 160.0 lb

## 2016-12-19 DIAGNOSIS — M130 Polyarthritis, unspecified: Secondary | ICD-10-CM

## 2016-12-19 DIAGNOSIS — M25552 Pain in left hip: Secondary | ICD-10-CM

## 2016-12-19 DIAGNOSIS — R7303 Prediabetes: Secondary | ICD-10-CM | POA: Diagnosis not present

## 2016-12-19 DIAGNOSIS — Z79899 Other long term (current) drug therapy: Secondary | ICD-10-CM | POA: Diagnosis not present

## 2016-12-19 DIAGNOSIS — Z23 Encounter for immunization: Secondary | ICD-10-CM

## 2016-12-19 DIAGNOSIS — G8929 Other chronic pain: Secondary | ICD-10-CM | POA: Diagnosis not present

## 2016-12-19 DIAGNOSIS — I10 Essential (primary) hypertension: Secondary | ICD-10-CM | POA: Diagnosis not present

## 2016-12-19 DIAGNOSIS — E7800 Pure hypercholesterolemia, unspecified: Secondary | ICD-10-CM | POA: Diagnosis not present

## 2016-12-19 DIAGNOSIS — R7301 Impaired fasting glucose: Secondary | ICD-10-CM | POA: Diagnosis not present

## 2016-12-19 DIAGNOSIS — E559 Vitamin D deficiency, unspecified: Secondary | ICD-10-CM

## 2016-12-19 LAB — CBC WITH DIFFERENTIAL/PLATELET
BASOS ABS: 0.1 10*3/uL (ref 0.0–0.1)
Basophils Relative: 0.6 % (ref 0.0–3.0)
EOS ABS: 0.2 10*3/uL (ref 0.0–0.7)
Eosinophils Relative: 1.6 % (ref 0.0–5.0)
HCT: 44 % (ref 36.0–46.0)
Hemoglobin: 14.7 g/dL (ref 12.0–15.0)
LYMPHS ABS: 2.4 10*3/uL (ref 0.7–4.0)
LYMPHS PCT: 23.8 % (ref 12.0–46.0)
MCHC: 33.4 g/dL (ref 30.0–36.0)
MCV: 87 fl (ref 78.0–100.0)
Monocytes Absolute: 0.7 10*3/uL (ref 0.1–1.0)
Monocytes Relative: 7.2 % (ref 3.0–12.0)
NEUTROS ABS: 6.6 10*3/uL (ref 1.4–7.7)
Neutrophils Relative %: 66.8 % (ref 43.0–77.0)
PLATELETS: 305 10*3/uL (ref 150.0–400.0)
RBC: 5.06 Mil/uL (ref 3.87–5.11)
RDW: 15.3 % (ref 11.5–15.5)
WBC: 9.9 10*3/uL (ref 4.0–10.5)

## 2016-12-19 LAB — VITAMIN D 25 HYDROXY (VIT D DEFICIENCY, FRACTURES): VITD: 32.56 ng/mL (ref 30.00–100.00)

## 2016-12-19 LAB — POCT GLYCOSYLATED HEMOGLOBIN (HGB A1C): Hemoglobin A1C: 5.7

## 2016-12-19 LAB — MICROALBUMIN / CREATININE URINE RATIO
CREATININE, U: 168.9 mg/dL
MICROALB/CREAT RATIO: 1 mg/g (ref 0.0–30.0)
Microalb, Ur: 1.7 mg/dL (ref 0.0–1.9)

## 2016-12-19 LAB — LDL CHOLESTEROL, DIRECT: Direct LDL: 87 mg/dL

## 2016-12-19 MED ORDER — CYCLOBENZAPRINE HCL 10 MG PO TABS: 10.0000 mg | ORAL_TABLET | Freq: Three times a day (TID) | ORAL | 1 refills | Status: AC | PRN

## 2016-12-19 NOTE — Progress Notes (Signed)
Subjective:  Patient ID: Lori Jimenez, female    DOB: 1940-12-03  Age: 76 y.o. MRN: [id number]  CC: The primary encounter diagnosis was Prediabetes. Diagnoses of Pure hypercholesterolemia, Essential hypertension, Vitamin D deficiency, Polyarthritis, Chronic left hip pain, Long-term use of high-risk medication, Encounter for immunization, Impaired fasting glucose, and Hip pain, chronic, left were also pertinent to this visit.  HPI Lori Jimenez presents for follow up  on multiple  Issues  Some joint stiffness .  Acute on chronic Left hip especially,  For the last year.  Aggravated by activity,  Sleeps on left side,  disrupting her sleep,  Also has right ankle / foot pain,  Chronic ,  Foot was Fractured in 2001 requiring surgical implantation of metal , now removed. Some cramping in foot and knee occurring intermittently.  Taking advil 1-2 daily. Making it difficult to walk distances > 150 feet; requesting  handicaped sticker.    Prediabetes:   Has lost about ten 5 lbs on the KETO diet,  Has stopped the diet,  Because she felt starved for sweets and potatoes . Feels she is Gluten sensitive .     Requesting flu shot.  Hypertension: patient checks blood pressure twice weekly at home.  Readings have been for the most part< 140/80 at rest . Patient is following a reduced salt diet most days and is taking medications as prescribed.  Tolerating meds  Lab Results  Component Value Date   HGBA1C 5.7 12/19/2016   Lab Results  Component Value Date   CHOL 161 06/20/2016   HDL 55.00 06/20/2016   LDLCALC 77 06/20/2016   LDLDIRECT 87.0 12/19/2016   TRIG 141.0 06/20/2016   CHOLHDL 3 06/20/2016     Outpatient Medications Prior to Visit  Medication Sig Dispense Refill  . aspirin 81 MG tablet Take 81 mg by mouth daily.    Marland Kitchen losartan-hydrochlorothiazide (HYZAAR) 100-25 MG tablet Take 1 tablet by mouth daily. 90 tablet 0  . losartan-hydrochlorothiazide (HYZAAR) 100-25 MG tablet TAKE 1 TABLET BY MOUTH  DAILY 90 tablet 0  . Probiotic Product (PROBIOTIC DAILY PO) Take 1 tablet by mouth daily.    . simvastatin (ZOCOR) 20 MG tablet TAKE 1 TABLET(20 MG) BY MOUTH EVERY EVENING 90 tablet 0  . traMADol (ULTRAM) 50 MG tablet Take 1 tablet (50 mg total) by mouth every 8 (eight) hours as needed for moderate pain. maximum two daily 120 tablet 4  . cyclobenzaprine (FLEXERIL) 10 MG tablet Take 1 tablet (10 mg total) by mouth 3 (three) times daily as needed for muscle spasms. 90 tablet 1   No facility-administered medications prior to visit.     Review of Systems;  Patient denies headache, fevers, malaise, unintentional weight loss, skin rash, eye pain, sinus congestion and sinus pain, sore throat, dysphagia,  hemoptysis , cough, dyspnea, wheezing, chest pain, palpitations, orthopnea, edema, abdominal pain, nausea, melena, diarrhea, constipation, flank pain, dysuria, hematuria, urinary  Frequency, nocturia, numbness, tingling, seizures,  Focal weakness, Loss of consciousness,  Tremor, insomnia, depression, anxiety, and suicidal ideation.      Objective:  BP 126/82 (BP Location: Left Arm, Patient Position: Sitting, Cuff Size: Normal)   Pulse 81   Temp 97.9 F (36.6 C) (Oral)   Resp 15   Ht _0  (1.575 m)   Wt 160 lb (72.6 kg)   SpO2 96%   BMI 29.26 kg/m   BP Readings from Last 3 Encounters:  12/19/16 126/82  08/16/16 128/76  06/20/16 (!) 126/92  Wt Readings from Last 3 Encounters:  12/19/16 160 lb (72.6 kg)  08/16/16 165 lb 12.8 oz (75.2 kg)  06/20/16 164 lb 6.4 oz (74.6 kg)    General appearance: alert, cooperative and appears stated age Ears: normal TM's and external ear canals both ears Throat: lips, mucosa, and tongue normal; teeth and gums normal Neck: no adenopathy, no carotid bruit, supple, symmetrical, trachea midline and thyroid not enlarged, symmetric, no tenderness/mass/nodules Back: symmetric, no curvature. ROM normal. No CVA tenderness. Lungs: clear to auscultation  bilaterally Heart: regular rate and rhythm, S1, S2 normal, no murmur, click, rub or gallop Abdomen: soft, non-tender; bowel sounds normal; no masses,  no organomegaly Pulses: 2+ and symmetric Skin: Skin color, texture, turgor normal. No rashes or lesions Lymph nodes: Cervical, supraclavicular, and axillary nodes normal.  Lab Results  Component Value Date   HGBA1C 5.7 12/19/2016   HGBA1C 6.1 06/20/2016   HGBA1C 5.9 12/14/2015    Lab Results  Component Value Date   CREATININE 0.78 12/19/2016   CREATININE 0.61 06/20/2016   CREATININE 0.68 12/14/2015    Lab Results  Component Value Date   WBC 9.9 12/19/2016   HGB 14.7 12/19/2016   HCT 44.0 12/19/2016   PLT 305.0 12/19/2016   GLUCOSE 109 (H) 12/19/2016   CHOL 161 06/20/2016   TRIG 141.0 06/20/2016   HDL 55.00 06/20/2016   LDLDIRECT 87.0 12/19/2016   LDLCALC 77 06/20/2016   ALT 16 12/19/2016   AST 20 12/19/2016   NA 138 12/19/2016   K 3.7 12/19/2016   CL 98 12/19/2016   CREATININE 0.78 12/19/2016   BUN 11 12/19/2016   CO2 30 12/19/2016   TSH 1.89 06/20/2016   INR 1.09 07/21/2014   HGBA1C 5.7 12/19/2016   MICROALBUR 1.7 12/19/2016    Mr Brain W WR Contrast  Result Date: 08/08/2016 CLINICAL DATA:  76 y/o F; new daily persistent headache posteriorly. Acquired cognitive dysfunction. Bilateral hand numbness. Short-term memory issues. EXAM: MRI HEAD WITHOUT AND WITH CONTRAST TECHNIQUE: Multiplanar, multiecho pulse sequences of the brain and surrounding structures were obtained without and with intravenous contrast. CONTRAST:  62m MULTIHANCE GADOBENATE DIMEGLUMINE 529 MG/ML IV SOLN COMPARISON:  None. FINDINGS: Brain: No acute infarction, hemorrhage, hydrocephalus, extra-axial collection or mass lesion. Scattered foci of T2 FLAIR hyperintense signal abnormality in subcortical and periventricular white matter as well as the pons compatible with moderate chronic microvascular ischemic changes. Mild brain parenchymal volume loss.  After administration of intravenous contrast there is no abnormal enhancement of the brain. Vascular: Normal flow voids. Skull and upper cervical spine: Normal marrow signal. Sinuses/Orbits: Negative. Other: None. IMPRESSION: 1. No acute intracranial abnormality or abnormal enhancement of the brain. 2. Moderate chronic microvascular ischemic changes and mild parenchymal volume loss of the brain. Electronically Signed   By: LKristine GarbeM.D.   On: 08/08/2016 17:24    Assessment & Plan:   Problem List Items Addressed This Visit    Essential hypertension    Well controlled on current regimen. Renal function stable, no changes today.  Lab Results  Component Value Date   CREATININE 0.78 12/19/2016  \ Lab Results  Component Value Date   NA 138 12/19/2016   K 3.7 12/19/2016   CL 98 12/19/2016   CO2 30 12/19/2016         Hip pain, chronic, left    She continues to have pain in her left hip and right ankle. Screening for inflammatory etiologies with CRP and ESR were normal.  Plain films of  hips were ordered but not done.  Continue advil 400 mg bid and add tyelnol up to 2000 mg daily        Relevant Medications   cyclobenzaprine (FLEXERIL) 10 MG tablet   Hyperlipidemia    LDL is at goal on current statin therapy.   Liver enzymes are normal , no changes today.  Lab Results  Component Value Date   CHOL 161 06/20/2016   HDL 55.00 06/20/2016   LDLCALC 77 06/20/2016   LDLDIRECT 87.0 12/19/2016   TRIG 141.0 06/20/2016   CHOLHDL 3 06/20/2016   Lab Results  Component Value Date   ALT 16 12/19/2016   AST 20 12/19/2016   ALKPHOS 56 06/20/2016   BILITOT 0.7 12/19/2016           Impaired fasting glucose    She reduced her a1c with a carbohydrate modified diet.  I have also recommended that patient start exercising with a goal of 30 minutes of aerobic exercise a minimum of 5 days per week.   Lab Results  Component Value Date   HGBA1C 5.7 12/19/2016          Other  Visit Diagnoses    Prediabetes    -  Primary   Relevant Orders   POCT HgB A1C (Completed)   Vitamin D deficiency       Chronic left hip pain       Relevant Medications   cyclobenzaprine (FLEXERIL) 10 MG tablet   Other Relevant Orders   DG HIP UNILAT WITH PELVIS 2-3 VIEWS LEFT   Long-term use of high-risk medication       Relevant Orders   Comprehensive metabolic panel (Completed)   Encounter for immunization       Relevant Orders   Flu vaccine HIGH DOSE PF (Completed)     A total of 25 minutes of face to face time was spent with patient more than half of which was spent in counselling about the above mentioned conditions  and coordination of care  I am having Ms. Quito maintain her aspirin, Probiotic Product (PROBIOTIC DAILY PO), traMADol, losartan-hydrochlorothiazide, simvastatin, losartan-hydrochlorothiazide, and cyclobenzaprine.  Meds ordered this encounter  Medications  . cyclobenzaprine (FLEXERIL) 10 MG tablet    Sig: Take 1 tablet (10 mg total) by mouth 3 (three) times daily as needed for muscle spasms.    Dispense:  90 tablet    Refill:  1    Medications Discontinued During This Encounter  Medication Reason  . cyclobenzaprine (FLEXERIL) 10 MG tablet Reorder    Follow-up: No Follow-up on file.   Crecencio Mc, MD

## 2016-12-19 NOTE — Patient Instructions (Addendum)
You can take up to 2000 mg of tylenol daily as a complement to your advil   your A1c is getting lower (that's good!) which means your chance of developing diabetes is decreasing. Continue to follow a Mediterranean style, low glycemic index diet, and try to get some exercise on a regular basis (goal is 30 minutes of continuous movement  5 days per week).  You do not need to check blood sugars .  I'll see you in 6 months

## 2016-12-20 ENCOUNTER — Encounter: Payer: Self-pay | Admitting: Internal Medicine

## 2016-12-20 LAB — COMPREHENSIVE METABOLIC PANEL
AG Ratio: 1.8 (calc) (ref 1.0–2.5)
ALT: 16 U/L (ref 6–29)
AST: 20 U/L (ref 10–35)
Albumin: 4.7 g/dL (ref 3.6–5.1)
Alkaline phosphatase (APISO): 66 U/L (ref 33–130)
BUN: 11 mg/dL (ref 7–25)
CO2: 30 mmol/L (ref 20–32)
CREATININE: 0.78 mg/dL (ref 0.60–0.93)
Calcium: 10.1 mg/dL (ref 8.6–10.4)
Chloride: 98 mmol/L (ref 98–110)
Globulin: 2.6 g/dL (calc) (ref 1.9–3.7)
Glucose, Bld: 109 mg/dL — ABNORMAL HIGH (ref 65–99)
Potassium: 3.7 mmol/L (ref 3.5–5.3)
Sodium: 138 mmol/L (ref 135–146)
Total Bilirubin: 0.7 mg/dL (ref 0.2–1.2)
Total Protein: 7.3 g/dL (ref 6.1–8.1)

## 2016-12-21 NOTE — Assessment & Plan Note (Signed)
She reduced her a1c with a carbohydrate modified diet.  I have also recommended that patient start exercising with a goal of 30 minutes of aerobic exercise a minimum of 5 days per week.   Lab Results  Component Value Date   HGBA1C 5.7 12/19/2016

## 2016-12-21 NOTE — Assessment & Plan Note (Signed)
LDL is at goal on current statin therapy.   Liver enzymes are normal , no changes today.  Lab Results  Component Value Date   CHOL 161 06/20/2016   HDL 55.00 06/20/2016   LDLCALC 77 06/20/2016   LDLDIRECT 87.0 12/19/2016   TRIG 141.0 06/20/2016   CHOLHDL 3 06/20/2016   Lab Results  Component Value Date   ALT 16 12/19/2016   AST 20 12/19/2016   ALKPHOS 56 06/20/2016   BILITOT 0.7 12/19/2016

## 2016-12-21 NOTE — Assessment & Plan Note (Signed)
Well controlled on current regimen. Renal function stable, no changes today.  Lab Results  Component Value Date   CREATININE 0.78 12/19/2016  \ Lab Results  Component Value Date   NA 138 12/19/2016   K 3.7 12/19/2016   CL 98 12/19/2016   CO2 30 12/19/2016

## 2016-12-21 NOTE — Assessment & Plan Note (Addendum)
She continues to have pain in her left hip and right ankle. Screening for inflammatory etiologies with CRP and ESR were normal.  Plain films of hips were ordered but not done.  Continue advil 400 mg bid and add tyelnol up to 2000 mg daily   

## 2016-12-27 ENCOUNTER — Telehealth: Payer: Self-pay | Admitting: Internal Medicine

## 2016-12-27 ENCOUNTER — Ambulatory Visit (INDEPENDENT_AMBULATORY_CARE_PROVIDER_SITE_OTHER): Payer: PPO

## 2016-12-27 ENCOUNTER — Other Ambulatory Visit: Payer: Self-pay | Admitting: Internal Medicine

## 2016-12-27 DIAGNOSIS — G8929 Other chronic pain: Secondary | ICD-10-CM | POA: Diagnosis not present

## 2016-12-27 DIAGNOSIS — M25552 Pain in left hip: Secondary | ICD-10-CM

## 2016-12-27 DIAGNOSIS — S79912A Unspecified injury of left hip, initial encounter: Secondary | ICD-10-CM | POA: Diagnosis not present

## 2016-12-27 NOTE — Telephone Encounter (Signed)
Pt dropped off a handicapp renewal form to be completed. Paper is up front in Dr. Lupita Dawn color folder.

## 2016-12-27 NOTE — Telephone Encounter (Signed)
Placed in red folder  

## 2016-12-28 NOTE — Telephone Encounter (Signed)
Signed and returned in red folder

## 2016-12-29 NOTE — Telephone Encounter (Signed)
LMTCB to let pt know that the handicap application has been completed she can come by and pick it up.

## 2017-01-31 ENCOUNTER — Other Ambulatory Visit: Payer: Self-pay | Admitting: Internal Medicine

## 2017-03-31 ENCOUNTER — Other Ambulatory Visit: Payer: Self-pay | Admitting: Internal Medicine

## 2017-05-07 ENCOUNTER — Telehealth: Payer: Self-pay | Admitting: *Deleted

## 2017-05-07 DIAGNOSIS — R7301 Impaired fasting glucose: Secondary | ICD-10-CM

## 2017-05-07 DIAGNOSIS — E785 Hyperlipidemia, unspecified: Secondary | ICD-10-CM

## 2017-05-07 DIAGNOSIS — I10 Essential (primary) hypertension: Secondary | ICD-10-CM

## 2017-05-07 NOTE — Telephone Encounter (Signed)
Copied from [id number]. Topic: Appointment Scheduling - Scheduling Inquiry for Clinic >> May 07, 2017 11:03 AM Aurelio Brash B wrote: Reason for CRM: Pt called to make an apt with Dr Derrel Nip for a follow up, she states she has these apts every 6 months and that she also has blood work done at these apts for medical necessity,  Her f/u apt is 4/23 but no lab apt made due to no lab order.

## 2017-05-09 NOTE — Telephone Encounter (Signed)
Urine micro alb/cr ratio  added

## 2017-05-09 NOTE — Telephone Encounter (Signed)
Ordered a CMP, A1C and lipid panel. Is there anything else that needs to be ordered?

## 2017-05-16 NOTE — Telephone Encounter (Signed)
Please schedule fasting lab appointment before 07/07/17

## 2017-05-16 NOTE — Telephone Encounter (Signed)
Lm on vm asking patient to call and schedule lab appt 1-2 days before her appt with Dr. Derrel Nip.

## 2017-05-25 ENCOUNTER — Other Ambulatory Visit: Payer: Self-pay | Admitting: Internal Medicine

## 2017-07-02 ENCOUNTER — Encounter: Payer: Self-pay | Admitting: Internal Medicine

## 2017-07-02 ENCOUNTER — Other Ambulatory Visit: Payer: PPO

## 2017-07-02 ENCOUNTER — Ambulatory Visit (INDEPENDENT_AMBULATORY_CARE_PROVIDER_SITE_OTHER): Payer: PPO | Admitting: Internal Medicine

## 2017-07-02 VITALS — BP 148/86 | HR 85 | Temp 98.2°F | Resp 15 | Ht 62.0 in | Wt 158.8 lb

## 2017-07-02 DIAGNOSIS — E559 Vitamin D deficiency, unspecified: Secondary | ICD-10-CM | POA: Diagnosis not present

## 2017-07-02 DIAGNOSIS — M7139 Other bursal cyst, multiple sites: Secondary | ICD-10-CM | POA: Diagnosis not present

## 2017-07-02 DIAGNOSIS — Z23 Encounter for immunization: Secondary | ICD-10-CM | POA: Diagnosis not present

## 2017-07-02 DIAGNOSIS — I10 Essential (primary) hypertension: Secondary | ICD-10-CM

## 2017-07-02 DIAGNOSIS — F09 Unspecified mental disorder due to known physiological condition: Secondary | ICD-10-CM

## 2017-07-02 DIAGNOSIS — E7800 Pure hypercholesterolemia, unspecified: Secondary | ICD-10-CM | POA: Diagnosis not present

## 2017-07-02 DIAGNOSIS — R7301 Impaired fasting glucose: Secondary | ICD-10-CM | POA: Diagnosis not present

## 2017-07-02 DIAGNOSIS — R42 Dizziness and giddiness: Secondary | ICD-10-CM | POA: Diagnosis not present

## 2017-07-02 LAB — COMPREHENSIVE METABOLIC PANEL
ALK PHOS: 59 U/L (ref 39–117)
ALT: 12 U/L (ref 0–35)
AST: 16 U/L (ref 0–37)
Albumin: 4.6 g/dL (ref 3.5–5.2)
BUN: 11 mg/dL (ref 6–23)
CHLORIDE: 98 meq/L (ref 96–112)
CO2: 30 mEq/L (ref 19–32)
Calcium: 9.9 mg/dL (ref 8.4–10.5)
Creatinine, Ser: 0.63 mg/dL (ref 0.40–1.20)
GFR: 96.88 mL/min (ref >60.00)
GLUCOSE: 96 mg/dL (ref 70–99)
POTASSIUM: 3.7 meq/L (ref 3.5–5.1)
SODIUM: 137 meq/L (ref 135–145)
TOTAL PROTEIN: 7.4 g/dL (ref 6.0–8.3)
Total Bilirubin: 0.6 mg/dL (ref 0.2–1.2)

## 2017-07-02 LAB — HEMOGLOBIN A1C: Hgb A1c MFr Bld: 5.9 % (ref 4.6–6.5)

## 2017-07-02 LAB — LIPID PANEL
CHOLESTEROL: 153 mg/dL (ref 0–200)
HDL: 53.2 mg/dL (ref >39.00)
LDL CALC: 72 mg/dL (ref 0–99)
NONHDL: 100.22
Total CHOL/HDL Ratio: 3
Triglycerides: 139 mg/dL (ref 0.0–149.0)
VLDL: 27.8 mg/dL (ref 0.0–40.0)

## 2017-07-02 LAB — MICROALBUMIN / CREATININE URINE RATIO
Creatinine,U: 70.7 mg/dL
MICROALB/CREAT RATIO: 1 mg/g (ref 0.0–30.0)
Microalb, Ur: <0.7 mg/dL (ref 0.0–1.9)

## 2017-07-02 LAB — VITAMIN B12: VITAMIN B 12: 431 pg/mL (ref 211–911)

## 2017-07-02 LAB — VITAMIN D 25 HYDROXY (VIT D DEFICIENCY, FRACTURES): VITD: 37.36 ng/mL (ref 30.00–100.00)

## 2017-07-02 MED ORDER — ZOSTER VAC RECOMB ADJUVANTED 50 MCG/0.5ML IM SUSR: 0.5000 mL | Freq: Once | INTRAMUSCULAR | 1 refills | Status: AC

## 2017-07-02 NOTE — Progress Notes (Signed)
Subjective:  Patient ID: Lori Jimenez, female    DOB: 14-Dec-1940  Age: 77 y.o. MRN: [id number]  CC: The primary encounter diagnosis was Impaired fasting glucose. Diagnoses of Essential hypertension, Dizziness, Vitamin D deficiency, Need for 23-polyvalent pneumococcal polysaccharide vaccine, Acquired cognitive dysfunction, and Pure hypercholesterolemia were also pertinent to this visit.  HPI Lori Jimenez presents for 6 month follow up on IPG,  Aortic atherosclerosis, hypertension and mild cognitive complaints .  Patient has many questions  Today and is reporting new onset headache just started today   Mild CGI:  Normal MMSE  MRI brain May 2018. Atrophy and microvascular changes noted.  HiHas developed a story of concussion in 2016. Retired Radio producer .  Deferred referral to neurology after last visit in October because symptoms improved .   She has developed a painful  Cystic swelling on both hands. right more than left .  Started several months ago,  Occurring between index and middle finger.  Uncomfortable,   But denies sharp pains,  No trauma.   Discussed referral to hand surgeon    Discussed shingrx vaccine   Losartan  Recall.  thinks she got a bad batch, had multiple unexplained symptoms prior to finding out that she may have had a recalled medication.  Describes poor balance, along with an outbreak of hives on her upper chest wall, and bone pain . Al sympomts resolved several days after stopping the medication .  Elevated BP:  Hungry,  Has Headache today,  Has taken Aleve in the last 48 hours for ankle pain .   Obeisty:  Wants to participate in [employer] program in [place] at [employer], but is concerned that it may aggravate her left knee pain .     Outpatient Medications Prior to Visit  Medication Sig Dispense Refill  . aspirin 81 MG tablet Take 81 mg by mouth daily.    . cyclobenzaprine (FLEXERIL) 10 MG tablet Take 1 tablet (10 mg total) by mouth 3 (three) times  daily as needed for muscle spasms. 90 tablet 1  . losartan-hydrochlorothiazide (HYZAAR) 100-25 MG tablet TAKE 1 TABLET BY MOUTH DAILY 90 tablet 1  . Probiotic Product (PROBIOTIC DAILY PO) Take 1 tablet by mouth daily.    . traMADol (ULTRAM) 50 MG tablet Take 1 tablet (50 mg total) by mouth every 8 (eight) hours as needed for moderate pain. maximum two daily 120 tablet 4  . simvastatin (ZOCOR) 20 MG tablet TAKE 1 TABLET(20 MG) BY MOUTH EVERY EVENING 90 tablet 0   No facility-administered medications prior to visit.     Review of Systems;  Patient denies headache, fevers, malaise, unintentional weight loss, skin rash, eye pain, sinus congestion and sinus pain, sore throat, dysphagia,  hemoptysis , cough, dyspnea, wheezing, chest pain, palpitations, orthopnea, edema, abdominal pain, nausea, melena, diarrhea, constipation, flank pain, dysuria, hematuria, urinary  Frequency, nocturia, numbness, tingling, seizures,  Focal weakness, Loss of consciousness,  Tremor, insomnia, depression, anxiety, and suicidal ideation.      Objective:  BP (!) 148/86 (BP Location: Left Arm, Patient Position: Sitting, Cuff Size: Normal)   Pulse 85   Temp 98.2 F (36.8 C) (Oral)   Resp 15   Ht 5\' 2"  (1.575 m)   Wt 158 lb 12.8 oz (72 kg)   SpO2 96%   BMI 29.04 kg/m   BP Readings from Last 3 Encounters:  07/02/17 (!) 148/86  12/19/16 126/82  08/16/16 128/76    Wt Readings from Last 3 Encounters:  07/02/17 158 lb 12.8 oz (72 kg)  12/19/16 160 lb (72.6 kg)  08/16/16 165 lb 12.8 oz (75.2 kg)    General appearance: alert, cooperative and appears stated age Ears: normal TM's and external ear canals both ears Throat: lips, mucosa, and tongue normal; teeth and gums normal Neck: no adenopathy, no carotid bruit, supple, symmetrical, trachea midline and thyroid not enlarged, symmetric, no tenderness/mass/nodules Back: symmetric, no curvature. ROM normal. No CVA tenderness. Lungs: clear to auscultation  bilaterally Heart: regular rate and rhythm, S1, S2 normal, no murmur, click, rub or gallop Abdomen: soft, non-tender; bowel sounds normal; no masses,  no organomegaly Pulses: 2+ and symmetric Skin: Skin color, texture, turgor normal. No rashes or lesions Lymph nodes: Cervical, supraclavicular, and axillary nodes normal.  Lab Results  Component Value Date   HGBA1C 5.9 07/02/2017   HGBA1C 5.7 12/19/2016   HGBA1C 6.1 06/20/2016    Lab Results  Component Value Date   CREATININE 0.63 07/02/2017   CREATININE 0.78 12/19/2016   CREATININE 0.61 06/20/2016    Lab Results  Component Value Date   WBC 9.9 12/19/2016   HGB 14.7 12/19/2016   HCT 44.0 12/19/2016   PLT 305.0 12/19/2016   GLUCOSE 96 07/02/2017   CHOL 153 07/02/2017   TRIG 139.0 07/02/2017   HDL 53.20 07/02/2017   LDLDIRECT 87.0 12/19/2016   LDLCALC 72 07/02/2017   ALT 12 07/02/2017   AST 16 07/02/2017   NA 137 07/02/2017   K 3.7 07/02/2017   CL 98 07/02/2017   CREATININE 0.63 07/02/2017   BUN 11 07/02/2017   CO2 30 07/02/2017   TSH 1.89 06/20/2016   INR 1.09 07/21/2014   HGBA1C 5.9 07/02/2017   MICROALBUR <0.7 07/02/2017    Mr Brain W HY Contrast  Result Date: 08/08/2016 CLINICAL DATA:  77 y/o F; new daily persistent headache posteriorly. Acquired cognitive dysfunction. Bilateral hand numbness. Short-term memory issues. EXAM: MRI HEAD WITHOUT AND WITH CONTRAST TECHNIQUE: Multiplanar, multiecho pulse sequences of the brain and surrounding structures were obtained without and with intravenous contrast. CONTRAST:  23mL MULTIHANCE GADOBENATE DIMEGLUMINE 529 MG/ML IV SOLN COMPARISON:  None. FINDINGS: Brain: No acute infarction, hemorrhage, hydrocephalus, extra-axial collection or mass lesion. Scattered foci of T2 FLAIR hyperintense signal abnormality in subcortical and periventricular white matter as well as the pons compatible with moderate chronic microvascular ischemic changes. Mild brain parenchymal volume loss. After  administration of intravenous contrast there is no abnormal enhancement of the brain. Vascular: Normal flow voids. Skull and upper cervical spine: Normal marrow signal. Sinuses/Orbits: Negative. Other: None. IMPRESSION: 1. No acute intracranial abnormality or abnormal enhancement of the brain. 2. Moderate chronic microvascular ischemic changes and mild parenchymal volume loss of the brain. Electronically Signed   By: Kristine Garbe M.D.   On: 08/08/2016 17:24    Assessment & Plan:   Problem List Items Addressed This Visit    Impaired fasting glucose - Primary   Relevant Orders   Comprehensive metabolic panel (Completed)   Hemoglobin A1c (Completed)   Hyperlipidemia    LDL is at goal on current statin therapy.   Liver enzymes are normal , no changes today.  Lab Results  Component Value Date   CHOL 153 07/02/2017   HDL 53.20 07/02/2017   LDLCALC 72 07/02/2017   LDLDIRECT 87.0 12/19/2016   TRIG 139.0 07/02/2017   CHOLHDL 3 07/02/2017   Lab Results  Component Value Date   ALT 12 07/02/2017   AST 16 07/02/2017   ALKPHOS 59 07/02/2017  BILITOT 0.6 07/02/2017           Essential hypertension    She has  has an elevated reading today in office.  sHe has been asked to check his BP at work and  submit readings for evaluation. Renal function will be checked today  Lab Results  Component Value Date   CREATININE 0.63 07/02/2017   Lab Results  Component Value Date   NA 137 07/02/2017   K 3.7 07/02/2017   CL 98 07/02/2017   CO2 30 07/02/2017         Relevant Orders   Lipid panel (Completed)   Microalbumin / creatinine urine ratio (Completed)   Acquired cognitive dysfunction    Secondary to concussion,  All symptoms have resolved.        Other Visit Diagnoses    Dizziness       Relevant Orders   Vitamin B12 (Completed)   Methylmalonic Acid   Vitamin D deficiency       Relevant Orders   VITAMIN D 25 Hydroxy (Vit-D Deficiency, Fractures) (Completed)   Need  for 23-polyvalent pneumococcal polysaccharide vaccine       Relevant Orders   Pneumococcal polysaccharide vaccine 23-valent greater than or equal to 2yo subcutaneous/IM (Completed)      I am having Marcy Panning. Cuccia start on Zoster Vaccine Adjuvanted. I am also having her maintain her aspirin, Probiotic Product (PROBIOTIC DAILY PO), traMADol, cyclobenzaprine, and losartan-hydrochlorothiazide.  Meds ordered this encounter  Medications  . Zoster Vaccine Adjuvanted [facility]) injection    Sig: Inject 0.5 mLs into the muscle once for 1 dose.    Dispense:  1 each    Refill:  1    There are no discontinued medications.  Follow-up: Return in about 6 months (around 01/01/2018).   Crecencio Mc, MD

## 2017-07-02 NOTE — Patient Instructions (Addendum)
You received the pneumonia vaccine (final dose) today   The ShingRx vaccine is now available in local pharmacies and is much more protective thant Zostavaxs,  It is therefore ADVISED for all interested adults over 50 to prevent shingles .  I have given you a prescription for it.    I am repeating your B12 level today since low B12 can affect balance. And low Vitamin D can cause bone pain  your blood pressure is elevated today  so I am recommending that you check your readings ot home both on days you take advil and on days you do not take advil  And let me know the readings,   Dr. Derrel Nip

## 2017-07-03 ENCOUNTER — Other Ambulatory Visit: Payer: Self-pay | Admitting: Internal Medicine

## 2017-07-03 DIAGNOSIS — M7139 Other bursal cyst, multiple sites: Secondary | ICD-10-CM | POA: Insufficient documentation

## 2017-07-03 NOTE — Assessment & Plan Note (Signed)
LDL is at goal on current statin therapy.   Liver enzymes are normal , no changes today.  Lab Results  Component Value Date   CHOL 153 07/02/2017   HDL 53.20 07/02/2017   LDLCALC 72 07/02/2017   LDLDIRECT 87.0 12/19/2016   TRIG 139.0 07/02/2017   CHOLHDL 3 07/02/2017   Lab Results  Component Value Date   ALT 12 07/02/2017   AST 16 07/02/2017   ALKPHOS 59 07/02/2017   BILITOT 0.6 07/02/2017

## 2017-07-03 NOTE — Assessment & Plan Note (Signed)
Secondary to concussion,  All symptoms have resolved.

## 2017-07-03 NOTE — Assessment & Plan Note (Signed)
She has  has an elevated reading today in office.  sHe has been asked to check his BP at work and  submit readings for evaluation. Renal function will be checked today  Lab Results  Component Value Date   CREATININE 0.63 07/02/2017   Lab Results  Component Value Date   NA 137 07/02/2017   K 3.7 07/02/2017   CL 98 07/02/2017   CO2 30 07/02/2017

## 2017-07-03 NOTE — Assessment & Plan Note (Signed)
She has one on each hand that have become painful,   Referral to hand surgeon

## 2017-07-04 LAB — METHYLMALONIC ACID, SERUM: Methylmalonic Acid, Quant: 117 nmol/L (ref 87–318)

## 2017-07-10 ENCOUNTER — Ambulatory Visit: Payer: PPO | Admitting: Internal Medicine

## 2017-09-11 DIAGNOSIS — G8929 Other chronic pain: Secondary | ICD-10-CM | POA: Diagnosis not present

## 2017-09-11 DIAGNOSIS — M79641 Pain in right hand: Secondary | ICD-10-CM | POA: Diagnosis not present

## 2017-09-11 DIAGNOSIS — M47819 Spondylosis without myelopathy or radiculopathy, site unspecified: Secondary | ICD-10-CM | POA: Diagnosis not present

## 2017-09-11 DIAGNOSIS — R7301 Impaired fasting glucose: Secondary | ICD-10-CM | POA: Diagnosis not present

## 2017-09-11 DIAGNOSIS — M8580 Other specified disorders of bone density and structure, unspecified site: Secondary | ICD-10-CM | POA: Diagnosis not present

## 2017-09-11 DIAGNOSIS — E7849 Other hyperlipidemia: Secondary | ICD-10-CM | POA: Diagnosis not present

## 2017-09-11 DIAGNOSIS — R4189 Other symptoms and signs involving cognitive functions and awareness: Secondary | ICD-10-CM | POA: Diagnosis not present

## 2017-09-11 DIAGNOSIS — I517 Cardiomegaly: Secondary | ICD-10-CM | POA: Diagnosis not present

## 2017-09-11 DIAGNOSIS — M25562 Pain in left knee: Secondary | ICD-10-CM | POA: Diagnosis not present

## 2017-09-11 DIAGNOSIS — I10 Essential (primary) hypertension: Secondary | ICD-10-CM | POA: Diagnosis not present

## 2017-09-11 DIAGNOSIS — M545 Low back pain: Secondary | ICD-10-CM | POA: Diagnosis not present

## 2017-09-11 DIAGNOSIS — Z87891 Personal history of nicotine dependence: Secondary | ICD-10-CM | POA: Diagnosis not present

## 2017-09-11 DIAGNOSIS — Z9181 History of falling: Secondary | ICD-10-CM | POA: Diagnosis not present

## 2017-09-11 DIAGNOSIS — M79642 Pain in left hand: Secondary | ICD-10-CM | POA: Diagnosis not present

## 2017-09-11 DIAGNOSIS — I700 Atherosclerosis of aorta: Secondary | ICD-10-CM | POA: Diagnosis not present

## 2017-09-11 DIAGNOSIS — M19079 Primary osteoarthritis, unspecified ankle and foot: Secondary | ICD-10-CM | POA: Diagnosis not present

## 2017-09-11 DIAGNOSIS — M549 Dorsalgia, unspecified: Secondary | ICD-10-CM | POA: Diagnosis not present

## 2017-09-11 DIAGNOSIS — Z7982 Long term (current) use of aspirin: Secondary | ICD-10-CM | POA: Diagnosis not present

## 2017-09-11 DIAGNOSIS — M180 Bilateral primary osteoarthritis of first carpometacarpal joints: Secondary | ICD-10-CM | POA: Diagnosis not present

## 2018-01-20 DIAGNOSIS — M419 Scoliosis, unspecified: Secondary | ICD-10-CM | POA: Diagnosis not present

## 2018-01-20 DIAGNOSIS — R1010 Upper abdominal pain, unspecified: Secondary | ICD-10-CM | POA: Diagnosis not present

## 2018-01-20 DIAGNOSIS — Z136 Encounter for screening for cardiovascular disorders: Secondary | ICD-10-CM | POA: Diagnosis not present

## 2018-01-20 DIAGNOSIS — M549 Dorsalgia, unspecified: Secondary | ICD-10-CM | POA: Diagnosis not present

## 2018-01-20 DIAGNOSIS — R079 Chest pain, unspecified: Secondary | ICD-10-CM | POA: Diagnosis not present

## 2018-01-20 DIAGNOSIS — M479 Spondylosis, unspecified: Secondary | ICD-10-CM | POA: Diagnosis not present

## 2018-01-30 DIAGNOSIS — R079 Chest pain, unspecified: Secondary | ICD-10-CM | POA: Diagnosis not present

## 2018-01-30 DIAGNOSIS — M549 Dorsalgia, unspecified: Secondary | ICD-10-CM | POA: Diagnosis not present

## 2018-01-30 DIAGNOSIS — Z87891 Personal history of nicotine dependence: Secondary | ICD-10-CM | POA: Diagnosis not present

## 2018-01-30 DIAGNOSIS — Z136 Encounter for screening for cardiovascular disorders: Secondary | ICD-10-CM | POA: Diagnosis not present
